# Patient Record
Sex: Female | Born: 1995 | Race: Black or African American | Hispanic: No | Marital: Single | Smoking: Never smoker
Health system: Southern US, Community
[De-identification: ages and names within clinical notes are randomized; demographics above are authoritative.]

## PROBLEM LIST (undated history)

## (undated) DIAGNOSIS — Z789 Other specified health status: Secondary | ICD-10-CM

## (undated) DIAGNOSIS — R55 Syncope and collapse: Secondary | ICD-10-CM

## (undated) DIAGNOSIS — D259 Leiomyoma of uterus, unspecified: Secondary | ICD-10-CM

## (undated) DIAGNOSIS — D509 Iron deficiency anemia, unspecified: Secondary | ICD-10-CM

## (undated) HISTORY — PX: NO PAST SURGERIES: SHX2092

---

## 2017-02-13 DIAGNOSIS — Z87898 Personal history of other specified conditions: Secondary | ICD-10-CM

## 2017-02-13 HISTORY — DX: Personal history of other specified conditions: Z87.898

## 2017-03-12 ENCOUNTER — Observation Stay (HOSPITAL_COMMUNITY)
Admission: EM | Admit: 2017-03-12 | Discharge: 2017-03-13 | Disposition: A | Discharge status: Home / Self Care | Payer: Self-pay | Attending: Internal Medicine | Admitting: Internal Medicine

## 2017-03-12 ENCOUNTER — Encounter (HOSPITAL_COMMUNITY): Payer: Self-pay | Admitting: Radiology

## 2017-03-12 ENCOUNTER — Emergency Department (HOSPITAL_COMMUNITY): Payer: Self-pay

## 2017-03-12 ENCOUNTER — Emergency Department (HOSPITAL_BASED_OUTPATIENT_CLINIC_OR_DEPARTMENT_OTHER): Payer: Self-pay

## 2017-03-12 DIAGNOSIS — R4182 Altered mental status, unspecified: Principal | ICD-10-CM

## 2017-03-12 DIAGNOSIS — R55 Syncope and collapse: Secondary | ICD-10-CM

## 2017-03-12 DIAGNOSIS — R404 Transient alteration of awareness: Secondary | ICD-10-CM | POA: Diagnosis present

## 2017-03-12 DIAGNOSIS — D61818 Other pancytopenia: Secondary | ICD-10-CM | POA: Insufficient documentation

## 2017-03-12 DIAGNOSIS — I361 Nonrheumatic tricuspid (valve) insufficiency: Secondary | ICD-10-CM

## 2017-03-12 DIAGNOSIS — E876 Hypokalemia: Secondary | ICD-10-CM | POA: Insufficient documentation

## 2017-03-12 DIAGNOSIS — I441 Atrioventricular block, second degree: Secondary | ICD-10-CM

## 2017-03-12 DIAGNOSIS — Z8249 Family history of ischemic heart disease and other diseases of the circulatory system: Secondary | ICD-10-CM | POA: Insufficient documentation

## 2017-03-12 DIAGNOSIS — G253 Myoclonus: Secondary | ICD-10-CM | POA: Insufficient documentation

## 2017-03-12 DIAGNOSIS — G9341 Metabolic encephalopathy: Secondary | ICD-10-CM | POA: Insufficient documentation

## 2017-03-12 HISTORY — DX: Syncope and collapse: R55

## 2017-03-12 LAB — RAPID URINE DRUG SCREEN, HOSP PERFORMED
Amphetamines: NOT DETECTED
BARBITURATES: NOT DETECTED
Benzodiazepines: NOT DETECTED
COCAINE: NOT DETECTED
OPIATES: NOT DETECTED
TETRAHYDROCANNABINOL: NOT DETECTED

## 2017-03-12 LAB — ETHANOL: Alcohol, Ethyl (B): <5 mg/dL (ref <5)

## 2017-03-12 LAB — CBC WITH DIFFERENTIAL/PLATELET
BASOS ABS: 0 10*3/uL (ref 0.0–0.1)
Basophils Relative: 0 %
Eosinophils Absolute: 0 10*3/uL (ref 0.0–0.7)
Eosinophils Relative: 0 %
HEMATOCRIT: 31.3 % — AB (ref 36.0–46.0)
HEMOGLOBIN: 9.9 g/dL — AB (ref 12.0–15.0)
LYMPHS ABS: 1.4 10*3/uL (ref 0.7–4.0)
LYMPHS PCT: 45 %
MCH: 23.3 pg — AB (ref 26.0–34.0)
MCHC: 31.6 g/dL (ref 30.0–36.0)
MCV: 73.8 fL — AB (ref 78.0–100.0)
Monocytes Absolute: 0.3 10*3/uL (ref 0.1–1.0)
Monocytes Relative: 8 %
NEUTROS ABS: 1.5 10*3/uL — AB (ref 1.7–7.7)
NEUTROS PCT: 47 %
Platelets: 112 10*3/uL — ABNORMAL LOW (ref 150–400)
RBC: 4.24 MIL/uL (ref 3.87–5.11)
RDW: 15.4 % (ref 11.5–15.5)
WBC: 3.1 10*3/uL — AB (ref 4.0–10.5)

## 2017-03-12 LAB — COMPREHENSIVE METABOLIC PANEL
ALK PHOS: 39 U/L (ref 38–126)
ALT: 13 U/L — AB (ref 14–54)
AST: 25 U/L (ref 15–41)
Albumin: 3.9 g/dL (ref 3.5–5.0)
Anion gap: 8 (ref 5–15)
BUN: 10 mg/dL (ref 6–20)
CALCIUM: 9.2 mg/dL (ref 8.9–10.3)
CO2: 25 mmol/L (ref 22–32)
CREATININE: 0.76 mg/dL (ref 0.44–1.00)
Chloride: 107 mmol/L (ref 101–111)
Glucose, Bld: 105 mg/dL — ABNORMAL HIGH (ref 65–99)
Potassium: 3.4 mmol/L — ABNORMAL LOW (ref 3.5–5.1)
Sodium: 140 mmol/L (ref 135–145)
Total Bilirubin: 0.4 mg/dL (ref 0.3–1.2)
Total Protein: 7.4 g/dL (ref 6.5–8.1)

## 2017-03-12 LAB — LACTATE DEHYDROGENASE: LDH: 128 U/L (ref 98–192)

## 2017-03-12 LAB — HIV ANTIBODY (ROUTINE TESTING W REFLEX): HIV Screen 4th Generation wRfx: NONREACTIVE

## 2017-03-12 LAB — ECHOCARDIOGRAM COMPLETE

## 2017-03-12 LAB — CBG MONITORING, ED: Glucose-Capillary: 127 mg/dL — ABNORMAL HIGH (ref 65–99)

## 2017-03-12 LAB — TSH: TSH: 3.958 u[IU]/mL (ref 0.350–4.500)

## 2017-03-12 LAB — RPR: RPR Ser Ql: NONREACTIVE

## 2017-03-12 LAB — PREGNANCY, URINE: Preg Test, Ur: NEGATIVE

## 2017-03-12 LAB — SAVE SMEAR

## 2017-03-12 LAB — MAGNESIUM: Magnesium: 1.8 mg/dL (ref 1.7–2.4)

## 2017-03-12 LAB — RETICULOCYTES
RBC.: 4.29 MIL/uL (ref 3.87–5.11)
Retic Count, Absolute: 17.2 10*3/uL — ABNORMAL LOW (ref 19.0–186.0)
Retic Ct Pct: 0.4 % (ref 0.4–3.1)

## 2017-03-12 LAB — FIBRINOGEN: FIBRINOGEN: 315 mg/dL (ref 210–475)

## 2017-03-12 MED ORDER — ONDANSETRON HCL 4 MG PO TABS: 4.0000 mg | ORAL_TABLET | Freq: Four times a day (QID) | ORAL | Status: DC | PRN

## 2017-03-12 MED ORDER — SODIUM CHLORIDE 0.9 % IV BOLUS (SEPSIS)
1000.0000 mL | Freq: Once | INTRAVENOUS | Status: AC
  Administered 2017-03-12: 1000 mL via INTRAVENOUS

## 2017-03-12 MED ORDER — ACETAMINOPHEN 325 MG PO TABS: 650.0000 mg | ORAL_TABLET | Freq: Four times a day (QID) | ORAL | Status: DC | PRN

## 2017-03-12 MED ORDER — POTASSIUM CHLORIDE CRYS ER 20 MEQ PO TBCR
40.0000 meq | EXTENDED_RELEASE_TABLET | Freq: Once | ORAL | Status: AC
  Administered 2017-03-12: 40 meq via ORAL
  Filled 2017-03-12: qty 2

## 2017-03-12 MED ORDER — DIPHENHYDRAMINE HCL 50 MG/ML IJ SOLN
25.0000 mg | Freq: Once | INTRAMUSCULAR | Status: AC
  Administered 2017-03-12: 25 mg via INTRAVENOUS
  Filled 2017-03-12: qty 1

## 2017-03-12 MED ORDER — SODIUM CHLORIDE 0.9 % IV SOLN: 250.0000 mL | INTRAVENOUS | Status: DC | PRN

## 2017-03-12 MED ORDER — PROCHLORPERAZINE EDISYLATE 5 MG/ML IJ SOLN
5.0000 mg | Freq: Once | INTRAMUSCULAR | Status: AC
  Administered 2017-03-12: 5 mg via INTRAVENOUS
  Filled 2017-03-12: qty 2

## 2017-03-12 MED ORDER — ACETAMINOPHEN 650 MG RE SUPP: 650.0000 mg | Freq: Four times a day (QID) | RECTAL | Status: DC | PRN

## 2017-03-12 MED ORDER — KETOROLAC TROMETHAMINE 30 MG/ML IJ SOLN
30.0000 mg | Freq: Once | INTRAMUSCULAR | Status: AC
  Administered 2017-03-12: 30 mg via INTRAVENOUS
  Filled 2017-03-12: qty 1

## 2017-03-12 MED ORDER — SODIUM CHLORIDE 0.9% FLUSH: 3.0000 mL | INTRAVENOUS | Status: DC | PRN

## 2017-03-12 MED ORDER — TRAMADOL HCL 50 MG PO TABS: 50.0000 mg | ORAL_TABLET | Freq: Four times a day (QID) | ORAL | Status: DC | PRN

## 2017-03-12 MED ORDER — ONDANSETRON HCL 4 MG/2ML IJ SOLN: 4.0000 mg | Freq: Four times a day (QID) | INTRAMUSCULAR | Status: DC | PRN

## 2017-03-12 MED ORDER — ENOXAPARIN SODIUM 40 MG/0.4ML ~~LOC~~ SOLN
40.0000 mg | SUBCUTANEOUS | Status: DC
  Administered 2017-03-12: 40 mg via SUBCUTANEOUS
  Filled 2017-03-12: qty 0.4

## 2017-03-12 MED ORDER — KETOROLAC TROMETHAMINE 30 MG/ML IJ SOLN
30.0000 mg | Freq: Four times a day (QID) | INTRAMUSCULAR | Status: DC | PRN
Start: 2017-03-12 — End: 2017-03-13

## 2017-03-12 MED ORDER — SODIUM CHLORIDE 0.9% FLUSH
3.0000 mL | Freq: Two times a day (BID) | INTRAVENOUS | Status: DC
  Administered 2017-03-12: 3 mL via INTRAVENOUS

## 2017-03-12 NOTE — ED Notes (Signed)
EKG given to EDP, Ward,MD., for review. 

## 2017-03-12 NOTE — ED Notes (Signed)
Hospitalitis @ bedside.

## 2017-03-12 NOTE — ED Notes (Signed)
MD Notified

## 2017-03-12 NOTE — Progress Notes (Signed)
  Echocardiogram 2D Echocardiogram has been performed.  Leta Jungling M 03/12/2017, 12:22 PM

## 2017-03-12 NOTE — Consult Note (Signed)
Cardiology Consultation:   Patient ID: Trenny Stenseth Franklin; [id number]; September 21, 1995   Admit date: 03/12/2017 Date of Consult: 03/12/2017  Primary Care Provider: Patient, No Pcp Per Primary Cardiologist: New Primary Electrophysiologist:  NA   Patient Profile:   Nicole Benton is a 21 y.o. female with a hx of syncope who is being seen today for the evaluation of syncope and 2:1 block  at the request of Dr. Aroor-Neurology.  History of Present Illness:   Nicole Benton 21 y.o. 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  HR lowest with this is 49.    Pt presented today after loss of consciousness,  She was working night shift and after dinner she placed her head down and was starring off into space.  EMS had difficulty waking her.   She had hiccup noises and intermittent myoclonic jerks.  She was lethargic afterwards.   Her father noted she had similar episodes in [place].  3 episodes there, and she was seen and she was told everything was normal.     In ER on TELE it was noticed she had 2:1 block.  Wenckebach type I.  HR briefly to 49.    CT head was normal.   Neurology has seen and plans outpt EEG, no driving for 6 months and follow up with Neuro as outpt.   MRI is ordered.    EKG is SR normal. Personally reviewed.   Telemetry:  Telemetry was personally reviewed and demonstrates:  SR and brief episodes of wenckebach type I  Labs:  K+ 3.4, Cr 0.76, LFTs normal  WBC 3.1, Hgb 9.9, Hct 31.3 Neg preg test,   Neg ETOH, neg drug screen.  Currently lethargic though wakes to calling her name.  She follows instructions.  Her father and uncle are with her.  Headed to MRI.  She denies any chest pain or SOB.    BP 120/80.   Past Medical History:  Diagnosis Date  . Syncope and collapse     History reviewed. No pertinent surgical history.   Home Medications:  Prior to Admission  medications   Not on File    Inpatient Medications: Scheduled Meds:  Continuous Infusions:  PRN Meds:   Allergies:   No Known Allergies  Social History:   Social History   Social History  . Marital status: Single    Spouse name: N/A  . Number of children: N/A  . Years of education: N/A   Occupational History  . Not on file.   Social History Main Topics  . Smoking status: Never Smoker  . Smokeless tobacco: Never Used  . Alcohol use No  . Drug use: No  . Sexual activity: Not on file   Other Topics Concern  . Not on file   Social History Narrative  . No narrative on file    Family History:    Family History  Problem Relation Age of Onset  . Hypertension Mother   . Heart disease Father   . Hypertension Father      ROS:  Please see the history of present illness.  ROS  General:no colds or fevers, no weight changes Skin:no rashes or ulcers HEENT:no blurred vision, no congestion CV:see HPI PUL:see HPI GI:no diarrhea constipation or melena, no indigestion GU:no hematuria, no dysuria MS:no joint pain, no claudication Neuro:no syncope, no lightheadedness Endo:no diabetes, no thyroid disease .  Physical Exam/Data:   Vitals:   03/12/17 0837 03/12/17 0917 03/12/17 0930 03/12/17 1000  BP:  119/82 131/88 120/80  Pulse: 64 64 70 64  Resp:  (!) 22 14 15   Temp:  97.9 F (36.6 C)    TempSrc:  Oral    SpO2: 100% 100% 100% 100%    Intake/Output Summary (Last 24 hours) at 03/12/17 1138 Last data filed at 03/12/17 0918  Gross per 24 hour  Intake             1000 ml  Output                0 ml  Net             1000 ml   There were no vitals filed for this visit. There is no height or weight on file to calculate BMI.  General:  Well nourished, well developed, in no acute distress lethargic HEENT: normal Lymph: no adenopathy Neck: no JVD Endocrine:  No thryomegaly Vascular: No carotid bruits; 2+ pedal pulses bil.  Cardiac:  normal S1, S2; RRR; no  murmur gallup rub or click Lungs:  clear to auscultation bilaterally, no wheezing, rhonchi or rales  Abd: soft, nontender, no hepatomegaly  Ext: no edema Musculoskeletal:  No deformities, BUE and BLE strength normal and equal Skin: warm and dry  Neuro:  Wakens to name call, moves all extremities, follows commands, lethargic  Psych:  Normal to flat affect     Relevant CV Studies: none  Laboratory Data:  Chemistry  Recent Labs Lab 03/12/17 0404  NA 140  K 3.4*  CL 107  CO2 25  GLUCOSE 105*  BUN 10  CREATININE 0.76  CALCIUM 9.2  GFRNONAA >60  GFRAA >60  ANIONGAP 8     Recent Labs Lab 03/12/17 0404  PROT 7.4  ALBUMIN 3.9  AST 25  ALT 13*  ALKPHOS 39  BILITOT 0.4   Hematology  Recent Labs Lab 03/12/17 0404  WBC 3.1*  RBC 4.24  HGB 9.9*  HCT 31.3*  MCV 73.8*  MCH 23.3*  MCHC 31.6  RDW 15.4  PLT 112*   Cardiac EnzymesNo results for input(s): TROPONINI in the last 168 hours. No results for input(s): TROPIPOC in the last 168 hours.  BNPNo results for input(s): BNP, PROBNP in the last 168 hours.  DDimer No results for input(s): DDIMER in the last 168 hours.  Radiology/Studies:  Ct Head Wo Contrast  Result Date: 03/12/2017 CLINICAL DATA:  Diffuse headache.  Syncopal episode at 02:00. EXAM: CT HEAD WITHOUT CONTRAST TECHNIQUE: Contiguous axial images were obtained from the base of the skull through the vertex without intravenous contrast. COMPARISON:  None. FINDINGS: Brain: There is no intracranial hemorrhage, mass or evidence of acute infarction. There is no extra-axial fluid collection. Gray matter and white matter appear normal. Cerebral volume is normal for age. Brainstem and posterior fossa are unremarkable. The CSF spaces appear normal. Vascular: No hyperdense vessel or unexpected calcification. Skull: Normal. Negative for fracture or focal lesion. Sinuses/Orbits: No acute finding. Other: None. IMPRESSION: Normal brain Electronically Signed   By: Ellery Plunk M.D.   On: 03/12/2017 05:06   Mr Brain Wo Contrast  Result Date: 03/12/2017 CLINICAL DATA:  Altered level consciousness.  Syncopal episode. EXAM: MRI HEAD WITHOUT CONTRAST TECHNIQUE: Multiplanar, multiecho pulse sequences of the brain and surrounding structures were obtained without intravenous contrast. COMPARISON:  CT head without contrast from the same day. FINDINGS: Brain: No acute infarct, hemorrhage, or mass  lesion is present. The ventricles are of normal size. No significant extraaxial fluid collection is present. No significant white matter disease is present. The brainstem and cerebellum are normal. Vascular: Flow is present in the major intracranial arteries. Skull and upper cervical spine: The skullbase is within normal limits. The craniocervical junction is normal. Midline sagittal structures are unremarkable. Sinuses/Orbits: The paranasal sinuses and mastoid air cells are clear. The globes and orbits are within normal limits. IMPRESSION: Negative MRI the brain. Electronically Signed   By: Marin Roberts M.D.   On: 03/12/2017 11:33    Assessment and Plan:   1. Syncope -still with lethargy, for MRI, CT head was normal --MRI of brain normal  2.         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  Will check stat echo to eval.  Dr. Eden Emms to see, but he believes she should stay overnight to monitor.    3.          Anemia and thrombocytopenia.   Signed, Nada Boozer, NP  03/12/2017 11:38 AM   Patient examined chart reviewed. Back from MRI which was normal. She is still lethargic and poorly responsive She did get "migraine" cocktail which may contribute. Telemetry review with no bradycardia and occasional wenkeback type dropped P waves that are not related to her staring episodes and odd behavior. Bedside echo being done and to my review so far normal with EF 60-65% Needs EEG and further toxic/metabolic w/u. No indication for further cardiac work up Exam wit  obese black female clear lungs with poor inspiratory effort no murmur MS lethargic and not communicative at this time   Charlton Haws

## 2017-03-12 NOTE — ED Notes (Signed)
Bed: WA20 Expected date:  Expected time:  Means of arrival:  Comments: 21 yr old syncopal episode

## 2017-03-12 NOTE — ED Triage Notes (Signed)
Pt brought from [facility] after syncopal episode after eating  Lunch at work per coworker and EMT at scene. Pt is from [place] and has been in [place] for the last year and speaks good english but remains mute during questioning

## 2017-03-12 NOTE — ED Provider Notes (Addendum)
Notified by nursing that seems to be having intermittent HB on monitor. Shown 12-lead which clearly sinus with nonspecific t-wave changes in precordial leads. Tele was then subsequently reviewed. Appears to have intermittent 2nd degree block. Mobitz II? Pt evaluated by neurology and recommending MRI. Cleared from neurology standpoint if negative. Possible cardiogenic syncope? Will obtain cardiology consultation. Minimal hypokalemia. Supplement. Check magnesium/tsh.       Raeford Razor, MD 03/12/17 [phone]

## 2017-03-12 NOTE — ED Provider Notes (Signed)
[facility]-EMERGENCY DEPT Provider Note   CSN: [id number] Arrival date & time: 03/12/17  0315     History   Chief Complaint Chief Complaint  Nicole Benton presents with  . Loss of Consciousness    HPI Nicole Benton is a 21 y.o. female Who is previously healthy who presents following a suspected syncopal episode. Nicole Benton was at work night shift and having lunch when she put her head down on the table according to her friend. Since that time, Nicole Benton has not talked and has been staring off into space. She has not been following commands. Nicole Benton intermittently has had shaking episodes. According to the Nicole Benton's father, episodes like this have happened 3 times before. Nicole Benton is from [place] and [place]. It has not happened since she moved.  HPI  History reviewed. No pertinent past medical history.  There are no active problems to display for this Nicole Benton.   No past surgical history on file.  OB History    No data available       Home Medications    Prior to Admission medications   Not on File    Family History No family history on file.  Social History Social History  Substance Use Topics  . Smoking status: Not on file  . Smokeless tobacco: Not on file  . Alcohol use Not on file     Allergies   Nicole Benton has no known allergies.   Review of Systems Review of Systems  Unable to perform ROS: Mental status change     Physical Exam Updated Vital Signs BP (!) 169/87 (BP Location: Left Arm)   Pulse 76   Temp 99.6 F (37.6 C) (Oral)   Resp 17   LMP 02/21/2017 (Exact Date)   SpO2 100%   Physical Exam  Constitutional: She appears well-developed and well-nourished. No distress.  HENT:  Head: Normocephalic and atraumatic.  Mouth/Throat: Oropharynx is clear and moist. No oropharyngeal exudate.  Eyes: Pupils are equal, round, and reactive to light. Conjunctivae are normal. Right eye exhibits no discharge. Left eye exhibits no  discharge. No scleral icterus.  Neck: Normal range of motion. Neck supple. No thyromegaly present.  Cardiovascular: Normal rate, regular rhythm, normal heart sounds and intact distal pulses.  Exam reveals no gallop and no friction rub.   No murmur heard. Pulmonary/Chest: Effort normal and breath sounds normal. No stridor. No respiratory distress. She has no wheezes. She has no rales.  Abdominal: Soft. Bowel sounds are normal. She exhibits no distension. There is no tenderness. There is no rebound and no guarding.  Musculoskeletal: She exhibits no edema.  Lymphadenopathy:    She has no cervical adenopathy.  Neurological: She is alert. Coordination normal.  Nicole Benton initially not following commands and could not complete neuro exam  Upon return to baseline: CN 3-12 intact; normal sensation throughout; 5/5 strength in all 4 extremities; equal bilateral grip strength  Skin: Skin is warm and dry. No rash noted. She is not diaphoretic. No pallor.  Psychiatric: She has a normal mood and affect.  Nursing note and vitals reviewed.    ED Treatments / Results  Labs (all labs ordered are listed, but only abnormal results are displayed) Labs Reviewed  CBC WITH DIFFERENTIAL/PLATELET - Abnormal; Notable for the following:       Result Value   WBC 3.1 (*)    Hemoglobin 9.9 (*)    HCT 31.3 (*)    MCV 73.8 (*)    MCH 23.3 (*)  Platelets 112 (*)    Neutro Abs 1.5 (*)    All other components within normal limits  COMPREHENSIVE METABOLIC PANEL - Abnormal; Notable for the following:    Potassium 3.4 (*)    Glucose, Bld 105 (*)    ALT 13 (*)    All other components within normal limits  CBG MONITORING, ED - Abnormal; Notable for the following:    Glucose-Capillary 127 (*)    All other components within normal limits  RAPID URINE DRUG SCREEN, HOSP PERFORMED  PREGNANCY, URINE  ETHANOL  RPR  HIV ANTIBODY (ROUTINE TESTING)    EKG  EKG Interpretation None       Radiology Ct Head Wo  Contrast  Result Date: 03/12/2017 CLINICAL DATA:  Diffuse headache.  Syncopal episode at 02:00. EXAM: CT HEAD WITHOUT CONTRAST TECHNIQUE: Contiguous axial images were obtained from the base of the skull through the vertex without intravenous contrast. COMPARISON:  None. FINDINGS: Brain: There is no intracranial hemorrhage, mass or evidence of acute infarction. There is no extra-axial fluid collection. Gray matter and white matter appear normal. Cerebral volume is normal for age. Brainstem and posterior fossa are unremarkable. The CSF spaces appear normal. Vascular: No hyperdense vessel or unexpected calcification. Skull: Normal. Negative for fracture or focal lesion. Sinuses/Orbits: No acute finding. Other: None. IMPRESSION: Normal brain Electronically Signed   By: Ellery Plunk M.D.   On: 03/12/2017 05:06    Procedures Procedures (including critical care time)  Medications Ordered in ED Medications  ketorolac (TORADOL) 30 MG/ML injection 30 mg (not administered)  sodium chloride 0.9 % bolus 1,000 mL (not administered)  prochlorperazine (COMPAZINE) injection 5 mg (not administered)  diphenhydrAMINE (BENADRYL) injection 25 mg (not administered)     Initial Impression / Assessment and Plan / ED Course  I have reviewed the triage vital signs and the nursing notes.  Pertinent labs & imaging results that were available during my care of the Nicole Benton were reviewed by me and considered in my medical decision making (see chart for details).     Upon Nicole Benton's return to baseline after no intervention in the ED, Nicole Benton reports that she does not remember what happened prior to feeling lightheaded and putting her head down on the table at lunch today. Nicole Benton reports this has happened 3 times in high school and she was sent to the hospital, but is not sure what happened or if she ever received a diagnosis. Nicole Benton denies any SI, HI, AVH. He denies any drug or alcohol use. She denies any concerns at  home or anyone hurting her. Nicole Benton does report she has had a headache for the past 3-4 days. It was improved with Advil at home. She does note that she hasn't been drinking very much water or eating much lately. I will order Toradol, Compazine, Benadryl, fluids.  Nicole Benton is pancytopenic, otherwise labs unremarkable. HIV, RPR pending. CT head is negative. Upon Nicole Benton's return to baseline, neuro exam is normal. I consulted neurologist, Dr. Laurence Slate, who recommends MRI of the brain and will evaluate the Nicole Benton and determine disposition. At shift change, Nicole Benton care transferred to Langston Masker, PA-C for continued evaluation, follow up of MR and neuro consul and determination of disposition. Anticipate discharge if MRI negative and cleared by neurology. Nicole Benton also evaluated by Dr. Elesa Massed who guided the Nicole Benton's management and agrees with plan.  Final Clinical Impressions(s) / ED Diagnoses   Final diagnoses:  Altered mental status, unspecified altered mental status type    New Prescriptions New Prescriptions  No medications on file     Emi Holes, [place] 03/12/17 3716

## 2017-03-12 NOTE — ED Triage Notes (Signed)
Pt father names is Revonda Humphrey cellphone [phone] and has not been reached to notify of daughters illness.

## 2017-03-12 NOTE — H&P (Signed)
History and Physical    Nicole Benton PQZ:[id number] DOB: 07-17-1995 DOA: 03/12/2017  PCP: Patient, No Pcp Per  Patient coming from: Home  Chief Complaint: Altered level of consciousness  HPI: Nicole Benton is a 21 y.o. female with no significant previous medical history. She presents emergency department after she was found to have a "staring" episode that was noted by her friend. This lasted more than 10 minutes. When the paramedics arrived, patient was difficult to arouse, continued to stare off into space, with intermittent hiccup noise with intermittent myoclonic jerks. She was lethargic for a few hours and has now slowly returned to baseline. Currently in the emergency department, patient has received migraine cocktail of Benadryl, Toradol, Compazine. She also works night shifts. She is very lethargic, awakens to voice but does not stay awake. I am unable to gather any meaningful history from patient or review of systems. History is gathered from ED physician, chart review, bedside RN.  ED Course: Patient was evaluated by neurology. The etiology of this spell is unclear, possible cardiovascular syncope versus seizure versus nonepileptic psychogenic spell. MRI was negative for structural abnormality. EKG revealed AV block and cardiology was consulted. They recommend echocardiogram and monitoring on telemetry overnight.  Review of Systems: Unable to gather as patient is very somnolent and lethargic in the emergency department.  Past Medical History:  Diagnosis Date  . Syncope and collapse     History reviewed. No pertinent surgical history.   reports that she has never smoked. She has never used smokeless tobacco. She reports that she does not drink alcohol or use drugs.  No Known Allergies  Family History  Problem Relation Age of Onset  . Hypertension Mother   . Heart disease Father   . Hypertension Father     Prior to Admission medications   Not on File     Physical Exam: Vitals:   03/12/17 0917 03/12/17 0930 03/12/17 1000 03/12/17 1226  BP: 119/82 131/88 120/80 109/62  Pulse: 64 70 64 62  Resp: (!) 22 14 15 18   Temp: 97.9 F (36.6 C)     TempSrc: Oral     SpO2: 100% 100% 100% 100%    Constitutional: NAD, calm, comfortable, lethargic, arousable to voice but does not stay awake  Eyes: PERRL, lids and conjunctivae normal ENMT: Mucous membranes are moist. Posterior pharynx clear of any exudate or lesions.Normal dentition.  Neck: normal, supple, no masses, no thyromegaly Respiratory: clear to auscultation bilaterally, no wheezing, no crackles. Normal respiratory effort. No accessory muscle use.  Cardiovascular: Regular rate and rhythm, no murmurs / rubs / gallops. No extremity edema.  Abdomen: no tenderness, no masses palpated. No hepatosplenomegaly. Bowel sounds positive.  Musculoskeletal: no clubbing / cyanosis. No joint deformity upper and lower extremities. Good ROM, no contractures. Normal muscle tone.  Skin: no rashes, lesions, ulcers. No induration Neurologic: Alert to voice, moves all extremities, very lethargic Psychiatric: Alert and oriented  Labs on Admission: I have personally reviewed following labs and imaging studies  CBC:  Recent Labs Lab 03/12/17 0404  WBC 3.1*  NEUTROABS 1.5*  HGB 9.9*  HCT 31.3*  MCV 73.8*  PLT 112*   Basic Metabolic Panel:  Recent Labs Lab 03/12/17 0404 03/12/17 0926  NA 140  --   K 3.4*  --   CL 107  --   CO2 25  --   GLUCOSE 105*  --   BUN 10  --   CREATININE 0.76  --   CALCIUM 9.2  --  MG  --  1.8   GFR: CrCl cannot be calculated (Unknown ideal weight.). Liver Function Tests:  Recent Labs Lab 03/12/17 0404  AST 25  ALT 13*  ALKPHOS 39  BILITOT 0.4  PROT 7.4  ALBUMIN 3.9   No results for input(s): LIPASE, AMYLASE in the last 168 hours. No results for input(s): AMMONIA in the last 168 hours. Coagulation Profile: No results for input(s): INR, PROTIME in the  last 168 hours. Cardiac Enzymes: No results for input(s): CKTOTAL, CKMB, CKMBINDEX, TROPONINI in the last 168 hours. BNP (last 3 results) No results for input(s): PROBNP in the last 8760 hours. HbA1C: No results for input(s): HGBA1C in the last 72 hours. CBG:  Recent Labs Lab 03/12/17 0330  GLUCAP 127*   Lipid Profile: No results for input(s): CHOL, HDL, LDLCALC, TRIG, CHOLHDL, LDLDIRECT in the last 72 hours. Thyroid Function Tests:  Recent Labs  03/12/17 0926  TSH 3.958   Anemia Panel: No results for input(s): VITAMINB12, FOLATE, FERRITIN, TIBC, IRON, RETICCTPCT in the last 72 hours. Urine analysis: No results found for: COLORURINE, APPEARANCEUR, LABSPEC, PHURINE, GLUCOSEU, HGBUR, BILIRUBINUR, KETONESUR, PROTEINUR, UROBILINOGEN, NITRITE, LEUKOCYTESUR Sepsis Labs: !!!!!!!!!!!!!!!!!!!!!!!!!!!!!!!!!!!!!!!!!!!! @LABRCNTIP (procalcitonin:4,lacticidven:4) )No results found for this or any previous visit (from the past 240 hour(s)).   Radiological Exams on Admission: Ct Head Wo Contrast  Result Date: 03/12/2017 CLINICAL DATA:  Diffuse headache.  Syncopal episode at 02:00. EXAM: CT HEAD WITHOUT CONTRAST TECHNIQUE: Contiguous axial images were obtained from the base of the skull through the vertex without intravenous contrast. COMPARISON:  None. FINDINGS: Brain: There is no intracranial hemorrhage, mass or evidence of acute infarction. There is no extra-axial fluid collection. Gray matter and white matter appear normal. Cerebral volume is normal for age. Brainstem and posterior fossa are unremarkable. The CSF spaces appear normal. Vascular: No hyperdense vessel or unexpected calcification. Skull: Normal. Negative for fracture or focal lesion. Sinuses/Orbits: No acute finding. Other: None. IMPRESSION: Normal brain Electronically Signed   By: Ellery Plunk M.D.   On: 03/12/2017 05:06   Mr Brain Wo Contrast  Result Date: 03/12/2017 CLINICAL DATA:  Altered level consciousness.  Syncopal  episode. EXAM: MRI HEAD WITHOUT CONTRAST TECHNIQUE: Multiplanar, multiecho pulse sequences of the brain and surrounding structures were obtained without intravenous contrast. COMPARISON:  CT head without contrast from the same day. FINDINGS: Brain: No acute infarct, hemorrhage, or mass lesion is present. The ventricles are of normal size. No significant extraaxial fluid collection is present. No significant white matter disease is present. The brainstem and cerebellum are normal. Vascular: Flow is present in the major intracranial arteries. Skull and upper cervical spine: The skullbase is within normal limits. The craniocervical junction is normal. Midline sagittal structures are unremarkable. Sinuses/Orbits: The paranasal sinuses and mastoid air cells are clear. The globes and orbits are within normal limits. IMPRESSION: Negative MRI the brain. Electronically Signed   By: Marin Roberts M.D.   On: 03/12/2017 11:33    EKG: Independently reviewed. Normal sinus rhythm with right axis deviation.  Assessment/Plan Principal Problem:   Altered level of consciousness  Altered level of consciousness -Possible etiologyof cardiovascular syncope, seizure, nonepileptic psychogenic spell -MRI brain unremarkable, UDS negative -Evaluated by Neurology, recommended EEG outpatient and outpatient neurology follow up. No antiepileptics as it is unclear if these are seizure spells or not. No driving x 6 months, avoid operating heavy machinery, climbing heights, swimming -Neuro checks q4h -May warrant psych consult if without improvement next 24 hours and possibly inpatient EEG   Wenckebach type 1 -  Cardiology following. Discussed with Dr. Eden Emms, her staring episode is not likelyto be related to her occasional Wenckebach with dropped P wave.  -Echo pending official results, Dr. Eden Emms reviewed at bedside and EF 60-65% -No plan for further cardiac work up  -Continue telemetry   Hypokalemia -Replaced in ED,  trend   Pancytopenia -?Etiology -Anemia panel    DVT prophylaxis: lovenox Code Status: Full Code assumed. No family at bedside, patient very lethargic and does not participate in exam or answer questions Family Communication: no family at bedside Disposition Plan: Pending next 24 hour course, stabilization and improvement. Suspect she can return home when stable Consults called: Neurology, Cardiology   Admission status: observation   Severity of Illness: The appropriate patient status for this patient is OBSERVATION. Observation status is judged to be reasonable and necessary in order to provide the required intensity of service to ensure the patient's safety. The patient's presenting symptoms, physical exam findings, and initial radiographic and laboratory data in the context of their medical condition is felt to place them at decreased risk for further clinical deterioration. Furthermore, it is anticipated that the patient will be medically stable for discharge from the hospital within 2 midnights of admission. The following factors support the patient status of observation.   " The patient's presenting symptoms include altered level of consciousness, staring episodes. " The physical exam findings include lethargy. " The initial radiographic and laboratory data are Wenckebach on telemetry.  Noralee Stain, DO [facility] [email or web address] Password [email or web address] 03/12/2017, 1:06 PM

## 2017-03-12 NOTE — ED Provider Notes (Signed)
Medical screening examination/treatment/procedure(s) were conducted as a shared visit with non-physician practitioner(s) and myself.  I personally evaluated the patient during the encounter.   EKG Interpretation None      Patient is a 21 year old female with no significant past medical history who presents to the emergency department with altered mental status.  Patient was doing well at work and eating lunch with her coworker when she laid her head down on the table and then became less responsive. It is unclear if there was a true syncopal event. She has been altered since that time. She will not answer questions or follow commands. She does seem to be moving her extremities normally on her own. She will not make eye contact and is looking around the room.  Her blood glucose is normal. Her labs show mild pancytopenia. Otherwise unremarkable. Head CT unremarkable. Neurology has been consulted. They recommended MRI of her brain. Patient's father has arrived and she now is able to talk and has a normal neurologic exam.  Plan was to have neurology evaluate the patient for further evaluation.   After my leaving the emergency department, it appears patient was noted to have episodes of intermittent heart block. Cardiology was consulted as well. Patient was admitted to medicine.   Ward, Layla Maw, DO 03/13/17 [phone]

## 2017-03-12 NOTE — ED Notes (Signed)
Urine specimen requested 

## 2017-03-12 NOTE — ED Notes (Signed)
Noticed patient has Heart Block. Printing EKG.

## 2017-03-12 NOTE — ED Notes (Signed)
Patient transported to MRI 

## 2017-03-12 NOTE — Consult Note (Addendum)
Neurology Consultation Reason for Consult: Passing out episode Referring Physician: Dr. Marylen Ponto  CC: Spell   HPI: Nicole Benton is a 21 y.o. female from [place] the past medical history of headaches who presents to the emergency room after having an event of loss of responsiveness.  She was working night shift, and after having dinner her friend noticed that she later placed her hand down on the table and was staring off. He states that this left episode lasted for more than 10 minutes and he failed in his attempts to bring her back to attention.  She would just stare off into space but denies any gaze deviation. He finally called for help and that when the paramedics arrived she was difficult to arouse. She continued to stare off into space and intermittently would make hiccup noises and have intermittent myoclonic jerks.  He attempted to take a video however is told by the EMS not to do so. The patient was lethargic for a few hours and has now slowly return to her baseline. Assessment she is talking and answering questions however seems to be disinterested and someone noncooperative when exam. She kept stating she was cold during the counter and was high underneath the blankets. She states that she does not remember anything preceding the event except that she had her usual headache  Father states that she had similar episodes while in [place]. The first episode occurred in October 2015 according to the patient. She was at school and was told she had a staring off spell. One thing she remembers she was in the hospital and was told by the doctor said everything was okay. The father contacted the patient's mother over the phone my presence and was told a similar story. Apparently she's been to the hospital about 3 times every time they would discharge her stating everything was fine and gave her paracetamol.  She also had an episode where she was talking to herself according to her father.  ROS:  A 14 point ROS was performed and is negative except as noted in the HPI.   History reviewed. No pertinent past medical history.   No family history on file. No family history of seizures. Her father has hypertension   Social History:  has no tobacco, alcohol, and drug history on file.   Exam: Current vital signs: BP 121/83   Pulse 71   Temp 98.2 F (36.8 C) (Oral)   Resp 18   LMP 02/21/2017 (Exact Date)   SpO2 100%  Vital signs in last 24 hours: Temp:  [98.2 F (36.8 C)-99.6 F (37.6 C)] 98.2 F (36.8 C) (08/28 0805) Pulse Rate:  [71-90] 71 (08/28 0805) Resp:  [16-24] 18 (08/28 0805) BP: (105-169)/(72-89) 121/83 (08/28 0805) SpO2:  [99 %-100 %] 100 % (08/28 0805)   Physical Exam  Constitutional: Appears well-developed and well-nourished,  cooperative on exam but requires repeated requests Psych: Affect appropriate to situation Eyes: No scleral injection HENT: No OP obstrucion Head: Normocephalic.  Cardiovascular: Normal rate and regular rhythm.  Respiratory: Effort normal and breath sounds normal to anterior ascultation GI: Soft.  No distension. There is no tenderness.  Skin: WDI  Neuro: Mental Status: Patient is awake, alert, oriented to person, place, month, year, and situation. No signs of aphasia or neglect Cranial Nerves: II: Visual Fields are full. Pupils are equal, round, and reactive to light.   III,IV, VI: EOMI without ptosis or diploplia.  V: Facial sensation is symmetric to temperature VII: Facial movement  is symmetric.  VIII: hearing is intact to voice X: Uvula elevates symmetrically XI: Shoulder shrug is symmetric. XII: tongue is midline without atrophy or fasciculations.  Motor: Tone is normal. Bulk is normal. 5/5 strength was present in all four extremities.  Sensory: Sensation is symmetric to light touch and temperature in the arms and legs. Deep Tendon Reflexes: 2+ and symmetric in the biceps and patellae.  Plantars: Toes are downgoing  bilaterally.  Cerebellar: FNF and HKS are intact bilaterally   EXAM: CT HEAD WITHOUT CONTRAST  TECHNIQUE: Contiguous axial images were obtained from the base of the skull through the vertex without intravenous contrast.  COMPARISON:  None.  FINDINGS: Brain: There is no intracranial hemorrhage, mass or evidence of acute infarction. There is no extra-axial fluid collection. Gray matter and white matter appear normal. Cerebral volume is normal for age. Brainstem and posterior fossa are unremarkable. The CSF spaces appear normal.  Vascular: No hyperdense vessel or unexpected calcification.  Skull: Normal. Negative for fracture or focal lesion.  Sinuses/Orbits: No acute finding.  Other: None.  IMPRESSION: Normal brain   Electronically Signed   By: Ellery Plunk M.D.   On: 03/12/2017 05:06    ASSESSMENT AND PLAN  Spells Unclear as to etiology of her spells-differentials include  cardiovascular syncope, seizures or nonepileptic psychogenic spells  Recommend brain MRI to rule out any structural abnormality EKG shows AV block, cardiology consult Routine EEG outpatient Ambulatory referral to neurology No AEDS as unclear if these spells are seizures No driving x 6 months, avoid operating heavy machinery, climbing heights, swimming - counselled patient and family regarding this.    Please call page Neurohospitalist on call ( Dr Amada Jupiter) if MRI shows abnormality      Georgiana Spinner Aroor MD [facility] [phone]  If 7pm to 7am, please call on call as listed on AMION.

## 2017-03-12 NOTE — ED Notes (Signed)
Cardiologist MD at bedside

## 2017-03-12 NOTE — ED Notes (Signed)
Nicole Benton receiving RN [phone]. Report scheduled for 1510

## 2017-03-12 NOTE — ED Provider Notes (Signed)
Neurology evaluated pt here.   Cardiology evaluated pt.  Cardiology plans to do echo.  Dr. Eden Emms has advised to have medicine admit for observation    Nicole Benton 03/12/17 1142    Raeford Razor, MD 03/12/17 [phone]

## 2017-03-12 NOTE — ED Notes (Signed)
Neuro MD at bedside

## 2017-03-13 ENCOUNTER — Observation Stay (HOSPITAL_BASED_OUTPATIENT_CLINIC_OR_DEPARTMENT_OTHER)
Admit: 2017-03-13 | Discharge: 2017-03-13 | Disposition: A | Discharge status: Home / Self Care | Payer: Self-pay | Attending: Neurology | Admitting: Neurology

## 2017-03-13 DIAGNOSIS — R404 Transient alteration of awareness: Secondary | ICD-10-CM

## 2017-03-13 LAB — CBC
HEMATOCRIT: 30 % — AB (ref 36.0–46.0)
HEMOGLOBIN: 9.4 g/dL — AB (ref 12.0–15.0)
MCH: 23.6 pg — AB (ref 26.0–34.0)
MCHC: 31.3 g/dL (ref 30.0–36.0)
MCV: 75.2 fL — ABNORMAL LOW (ref 78.0–100.0)
Platelets: 103 10*3/uL — ABNORMAL LOW (ref 150–400)
RBC: 3.99 MIL/uL (ref 3.87–5.11)
RDW: 15.6 % — AB (ref 11.5–15.5)
WBC: 2.6 10*3/uL — ABNORMAL LOW (ref 4.0–10.5)

## 2017-03-13 LAB — BASIC METABOLIC PANEL
Anion gap: 7 (ref 5–15)
BUN: 9 mg/dL (ref 6–20)
CALCIUM: 8.3 mg/dL — AB (ref 8.9–10.3)
CHLORIDE: 107 mmol/L (ref 101–111)
CO2: 22 mmol/L (ref 22–32)
CREATININE: 0.72 mg/dL (ref 0.44–1.00)
GFR calc non Af Amer: >60 mL/min (ref >60)
GLUCOSE: 83 mg/dL (ref 65–99)
Potassium: 3.6 mmol/L (ref 3.5–5.1)
Sodium: 136 mmol/L (ref 135–145)

## 2017-03-13 LAB — IRON AND TIBC
Iron: 19 ug/dL — ABNORMAL LOW (ref 28–170)
SATURATION RATIOS: 6 % — AB (ref 10.4–31.8)
TIBC: 335 ug/dL (ref 250–450)
UIBC: 316 ug/dL

## 2017-03-13 LAB — FOLATE RBC
FOLATE, HEMOLYSATE: 252.4 ng/mL
Folate, RBC: 806 ng/mL (ref >498)
Hematocrit: 31.3 % — ABNORMAL LOW (ref 34.0–46.6)

## 2017-03-13 LAB — VITAMIN B12: VITAMIN B 12: 740 pg/mL (ref 180–914)

## 2017-03-13 LAB — HAPTOGLOBIN: Haptoglobin: 174 mg/dL (ref 34–200)

## 2017-03-13 LAB — FERRITIN: Ferritin: 49 ng/mL (ref 11–307)

## 2017-03-13 NOTE — Care Management Note (Signed)
Case Management Note  Patient Details  Name: Nicole Michaelisugenia Andrews Benton MRN: [id number] Date of Birth: Oct 24, 1995  Subjective/Objective:  21 y/o f admitted w/Altered level of consciousness. 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 Has parent support. No pcp-provided w/pcp listing-encouraged [facility]--Father has called [facility] for hospital f/u appt. No further CM needs.                  Action/Plan:d/c home.   Expected Discharge Date:   (unknown)               Expected Discharge Plan:  Home/Self Care  In-House Referral:     Discharge planning Services  CM Consult, [facility]  Post Acute Care Choice:    Choice offered to:     DME Arranged:    DME Agency:     HH Arranged:    HH Agency:     Status of Service:  Completed, signed off  If discussed at [employer] Length of Stay Meetings, dates discussed:    Additional Comments:  Lanier ClamMahabir, Kache Mcclurg, RN 03/13/2017, 10:10 AM

## 2017-03-13 NOTE — Procedures (Signed)
ELECTROENCEPHALOGRAM REPORT  Date of Study: 03/13/2017  Patient's Name: Nicole Benton MRN: [id number] Date of Birth: [birth year]  Referring Provider: Dr. Ritta SlotMcNeill Kirkpatrick  Clinical History: This is a 21 year old woman with episodes of loss of responsiveness and staring.  Medications: acetaminophen (TYLENOL) tablet 650 mg  enoxaparin (LOVENOX) injection 40 mg  ketorolac (TORADOL) 30 MG/ML injection 30 mg  traMADol (ULTRAM) tablet 50 mg   Technical Summary: A multichannel digital EEG recording measured by the international 10-20 system with electrodes applied with paste and impedances below 5000 ohms performed in our laboratory with EKG monitoring in an awake and asleep patient.  Hyperventilation and photic stimulation were performed.  The digital EEG was referentially recorded, reformatted, and digitally filtered in a variety of bipolar and referential montages for optimal display.    Description: The patient is awake and asleep during the recording.  During maximal wakefulness, there is a symmetric, medium voltage 9 Hz posterior dominant rhythm that attenuates with eye opening.  The record is symmetric.  During drowsiness and sleep, there is an increase in theta slowing of the background, at times occurring in higher voltage bursts during drowsiness.  Vertex waves and symmetric sleep spindles were seen.  Hyperventilation and photic stimulation did not elicit any abnormalities.  There were no epileptiform discharges or electrographic seizures seen.    EKG lead was unremarkable.  Impression: This awake and asleep EEG is normal.    Clinical Correlation: A normal EEG does not exclude a clinical diagnosis of epilepsy.  Clinical correlation is advised.   Patrcia DollyKaren Myya Meenach, M.D.

## 2017-03-13 NOTE — Discharge Instructions (Signed)

## 2017-03-13 NOTE — Progress Notes (Signed)
Subjective: No further spells  Exam: Vitals:   03/12/17 2119 03/13/17 0512  BP: (!) 110/54 121/74  Pulse: 66 63  Resp: 18 16  Temp: 98.3 F (36.8 C) 98.2 F (36.8 C)  SpO2: 100% 100%    HEENT-  Normocephalic, no lesions, without obvious abnormality.  Normal external eye and conjunctiva.  Normal TM's bilaterally.  Normal auditory canals and external ears. Normal external nose, mucus membranes and septum.  Normal pharynx.    Neuro:  CN: Pupils are equal and round. They are symmetrically reactive from 3-->2 mm. EOMI without nystagmus. Facial sensation is intact to light touch. Face is symmetric at rest with normal strength and mobility. Hearing is intact to conversational voice. Palate elevates symmetrically and uvula is midline. Voice is normal in tone, pitch and quality. Bilateral SCM and trapezii are 5/5. Tongue is midline with normal bulk and mobility.  Motor: Normal bulk, tone, and strength. 5/5 throughout. No drift.  Sensation: Intact to light touch.  DTRs: 2+, symmetric  Toes downgoing bilaterally. No pathologic reflexes.  Coordination: Finger-to-nose and heel-to-shin are without dysmetria     Pertinent Labs/Diagnostics: MRI (-) EEG pending    Impression:  Spells Unclear as to etiology of her spells-differentials include  cardiovascular syncope, seizures or nonepileptic psychogenic spells. Cardiology does not feel this is the cause at this time. IF EEG is negative no further work up from neuro stand point. IF happens again would have her estabilish a out patint neurologist. Neurology S/O  Felicie Mornavid Smith PA-C [facility] [phone]   03/13/2017, 9:59 AM

## 2017-03-13 NOTE — Discharge Summary (Signed)
Physician Discharge Summary  Nicole Benton WUJ:[id number] DOB: 17-Dec-1995 DOA: 03/12/2017  PCP: Patient, No Pcp Per  Admit date: 03/12/2017 Discharge date: 03/13/2017  Recommendations for Outpatient Follow-up:  1. Please follow up with PCP in 1 week, recheck CBC and if it is lower in regards to platelets or WBC count pt needs to follow up with hematology (number provided on d/c summary)  Discharge Diagnoses:  Principal Problem:   Altered level of consciousness    Discharge Condition: stable   Diet recommendation: as tolerated   History of present illness:   Per HPI "21 y.o. female with no significant previous medical history. She presents emergency department after she was found to have a "staring" episode that was noted by her friend. This lasted more than 10 minutes. When the paramedics arrived, patient was difficult to arouse, continued to stare off into space, with intermittent hiccup noise with intermittent myoclonic jerks. She was lethargic for a few hours and has now slowly returned to baseline. Currently in the emergency department, patient has received migraine cocktail of Benadryl, Toradol, Compazine. She also works night shifts. She is very lethargic, awakens to voice but does not stay awake. I am unable to gather any meaningful history from patient or review of systems. History is gathered from ED physician, chart review, bedside RN. Patient was evaluated by neurology. The etiology of this spell is unclear, possible cardiovascular syncope versus seizure versus nonepileptic psychogenic spell. MRI was negative for structural abnormality. EKG revealed AV block and cardiology was consulted. They recommend echocardiogram and monitoring on telemetry overnight."   Hospital Course:    Principal Problem: Acute metabolic encephalopathy - Unclear etiology - CT head and MRI brain negative - EEG - pending - Not on any antiepileptic meds - Mental status at  baseline  Pancytopenia - HIV negative - RPR non reactive - Needs out pt follow up in 1 week (i spoke with Dr. Candise Che who said pt can follow up outpt in 1 week, repeat cbc and if lower she need to follow up with hematology further; this was communicated to the pt    Signed:  Manson Passey, MD  [facility] 03/13/2017, 10:17 AM  Pager #: [phone]  Time spent in minutes: more than 30 minutes    Discharge Exam: Vitals:   03/12/17 2119 03/13/17 0512  BP: (!) 110/54 121/74  Pulse: 66 63  Resp: 18 16  Temp: 98.3 F (36.8 C) 98.2 F (36.8 C)  SpO2: 100% 100%   Vitals:   03/12/17 1500 03/12/17 1615 03/12/17 2119 03/13/17 0512  BP: 112/69 120/60 (!) 110/54 121/74  Pulse: 66 71 66 63  Resp: 19 20 18 16   Temp:  98.3 F (36.8 C) 98.3 F (36.8 C) 98.2 F (36.8 C)  TempSrc:  Oral Oral Oral  SpO2: 100% 100% 100% 100%  Weight:  71.3 kg (157 lb 3 oz)    Height:  5\' 3"  (1.6 m)      General: Pt is alert, follows commands appropriately, not in acute distress Cardiovascular: Regular rate and rhythm, S1/S2 +, no murmurs Respiratory: Clear to auscultation bilaterally, no wheezing, no crackles, no rhonchi Abdominal: Soft, non tender, non distended, bowel sounds +, no guarding Extremities: no edema, no cyanosis, pulses palpable bilaterally DP and PT Neuro: Grossly nonfocal  Discharge Instructions  Discharge Instructions    Call MD for:  persistant nausea and vomiting    Complete by:  As directed    Call MD for:  redness, tenderness, or signs of  infection (pain, swelling, redness, odor or green/yellow discharge around incision site)    Complete by:  As directed    Call MD for:  severe uncontrolled pain    Complete by:  As directed    Diet - low sodium heart healthy    Complete by:  As directed    Increase activity slowly    Complete by:  As directed      Allergies as of 03/13/2017   No Known Allergies     Medication List    You have not been prescribed any  medications.          Discharge Care Instructions        Start     Ordered   03/13/17 0000  Increase activity slowly     03/13/17 1016   03/13/17 0000  Diet - low sodium heart healthy     03/13/17 1016   03/13/17 0000  Call MD for:  persistant nausea and vomiting     03/13/17 1016   03/13/17 0000  Call MD for:  severe uncontrolled pain     03/13/17 1016   03/13/17 0000  Call MD for:  redness, tenderness, or signs of infection (pain, swelling, redness, odor or green/yellow discharge around incision site)     03/13/17 1016     Follow-up Information    [facility]. Schedule an appointment as soon as possible for a visit.   Why:  make appt within 1 week;bring photo ID,discharge instructions Contact information: [address] [facility] [address] [phone]           The results of significant diagnostics from this hospitalization (including imaging, microbiology, ancillary and laboratory) are listed below for reference.    Significant Diagnostic Studies: Ct Head Wo Contrast  Result Date: 03/12/2017 CLINICAL DATA:  Diffuse headache.  Syncopal episode at 02:00. EXAM: CT HEAD WITHOUT CONTRAST TECHNIQUE: Contiguous axial images were obtained from the base of the skull through the vertex without intravenous contrast. COMPARISON:  None. FINDINGS: Brain: There is no intracranial hemorrhage, mass or evidence of acute infarction. There is no extra-axial fluid collection. Gray matter and white matter appear normal. Cerebral volume is normal for age. Brainstem and posterior fossa are unremarkable. The CSF spaces appear normal. Vascular: No hyperdense vessel or unexpected calcification. Skull: Normal. Negative for fracture or focal lesion. Sinuses/Orbits: No acute finding. Other: None. IMPRESSION: Normal brain Electronically Signed   By: Ellery Plunk M.D.   On: 03/12/2017 05:06   Mr Brain Wo Contrast  Result Date:  03/12/2017 CLINICAL DATA:  Altered level consciousness.  Syncopal episode. EXAM: MRI HEAD WITHOUT CONTRAST TECHNIQUE: Multiplanar, multiecho pulse sequences of the brain and surrounding structures were obtained without intravenous contrast. COMPARISON:  CT head without contrast from the same day. FINDINGS: Brain: No acute infarct, hemorrhage, or mass lesion is present. The ventricles are of normal size. No significant extraaxial fluid collection is present. No significant white matter disease is present. The brainstem and cerebellum are normal. Vascular: Flow is present in the major intracranial arteries. Skull and upper cervical spine: The skullbase is within normal limits. The craniocervical junction is normal. Midline sagittal structures are unremarkable. Sinuses/Orbits: The paranasal sinuses and mastoid air cells are clear. The globes and orbits are within normal limits. IMPRESSION: Negative MRI the brain. Electronically Signed   By: Marin Roberts M.D.   On: 03/12/2017 11:33    Microbiology: No results found for this or any previous visit (from  the past 240 hour(s)).   Labs: Basic Metabolic Panel:  Recent Labs Lab 03/12/17 0404 03/12/17 0926 03/13/17 0427  NA 140  --  136  K 3.4*  --  3.6  CL 107  --  107  CO2 25  --  22  GLUCOSE 105*  --  83  BUN 10  --  9  CREATININE 0.76  --  0.72  CALCIUM 9.2  --  8.3*  MG  --  1.8  --    Liver Function Tests:  Recent Labs Lab 03/12/17 0404  AST 25  ALT 13*  ALKPHOS 39  BILITOT 0.4  PROT 7.4  ALBUMIN 3.9   No results for input(s): LIPASE, AMYLASE in the last 168 hours. No results for input(s): AMMONIA in the last 168 hours. CBC:  Recent Labs Lab 03/12/17 0404 03/13/17 0427  WBC 3.1* 2.6*  NEUTROABS 1.5*  --   HGB 9.9* 9.4*  HCT 31.3* 30.0*  MCV 73.8* 75.2*  PLT 112* 103*   Cardiac Enzymes: No results for input(s): CKTOTAL, CKMB, CKMBINDEX, TROPONINI in the last 168 hours. BNP: BNP (last 3 results) No results for  input(s): BNP in the last 8760 hours.  ProBNP (last 3 results) No results for input(s): PROBNP in the last 8760 hours.  CBG:  Recent Labs Lab 03/12/17 0330  GLUCAP 127*

## 2017-03-13 NOTE — Progress Notes (Signed)
Progress Note  Patient Name: Nicole Benton Date of Encounter: 03/13/2017  Primary Cardiologist: None  Subjective   Bit more awake and communicative this am Getting EEG leads applied   Inpatient Medications    Scheduled Meds: . enoxaparin (LOVENOX) injection  40 mg Subcutaneous Q24H  . sodium chloride flush  3 mL Intravenous Q12H   Continuous Infusions: . sodium chloride     PRN Meds: sodium chloride, acetaminophen **OR** acetaminophen, ketorolac, ondansetron **OR** ondansetron (ZOFRAN) IV, sodium chloride flush, traMADol   Vital Signs    Vitals:   03/12/17 1500 03/12/17 1615 03/12/17 2119 03/13/17 0512  BP: 112/69 120/60 (!) 110/54 121/74  Pulse: 66 71 66 63  Resp: 19 20 18 16   Temp:  98.3 F (36.8 C) 98.3 F (36.8 C) 98.2 F (36.8 C)  TempSrc:  Oral Oral Oral  SpO2: 100% 100% 100% 100%  Weight:  157 lb 3 oz (71.3 kg)    Height:  5\' 3"  (1.6 m)      Intake/Output Summary (Last 24 hours) at 03/13/17 0857 Last data filed at 03/12/17 1800  Gross per 24 hour  Intake             1000 ml  Output                0 ml  Net             1000 ml   Filed Weights   03/12/17 1615  Weight: 157 lb 3 oz (71.3 kg)    Telemetry    NSR rare sinus exit block no long pauses - Personally Reviewed  ECG    NSR no acute ST changes  - Personally Reviewed  Physical Exam  Obese black female  GEN: No acute distress.   Neck: No JVD Cardiac: RRR, no murmurs, rubs, or gallops.  Respiratory: Clear to auscultation bilaterally. GI: Soft, nontender, non-distended  MS: No edema; No deformity. Neuro:  Nonfocal  Psych: Normal affect   Labs    Chemistry Recent Labs Lab 03/12/17 0404 03/13/17 0427  NA 140 136  K 3.4* 3.6  CL 107 107  CO2 25 22  GLUCOSE 105* 83  BUN 10 9  CREATININE 0.76 0.72  CALCIUM 9.2 8.3*  PROT 7.4  --   ALBUMIN 3.9  --   AST 25  --   ALT 13*  --   ALKPHOS 39  --   BILITOT 0.4  --   GFRNONAA >60 >60  GFRAA >60 >60  ANIONGAP 8 7      Hematology Recent Labs Lab 03/12/17 0404 03/12/17 1700 03/13/17 0427  WBC 3.1*  --  2.6*  RBC 4.24 4.29 3.99  HGB 9.9*  --  9.4*  HCT 31.3*  --  30.0*  MCV 73.8*  --  75.2*  MCH 23.3*  --  23.6*  MCHC 31.6  --  31.3  RDW 15.4  --  15.6*  PLT 112*  --  103*    Cardiac EnzymesNo results for input(s): TROPONINI in the last 168 hours. No results for input(s): TROPIPOC in the last 168 hours.   BNPNo results for input(s): BNP, PROBNP in the last 168 hours.   DDimer No results for input(s): DDIMER in the last 168 hours.   Radiology    Ct Head Wo Contrast  Result Date: 03/12/2017 CLINICAL DATA:  Diffuse headache.  Syncopal episode at 02:00. EXAM: CT HEAD WITHOUT CONTRAST TECHNIQUE: Contiguous axial images were obtained from the base of the  skull through the vertex without intravenous contrast. COMPARISON:  None. FINDINGS: Brain: There is no intracranial hemorrhage, mass or evidence of acute infarction. There is no extra-axial fluid collection. Gray matter and white matter appear normal. Cerebral volume is normal for age. Brainstem and posterior fossa are unremarkable. The CSF spaces appear normal. Vascular: No hyperdense vessel or unexpected calcification. Skull: Normal. Negative for fracture or focal lesion. Sinuses/Orbits: No acute finding. Other: None. IMPRESSION: Normal brain Electronically Signed   By: Ellery Plunkaniel R Mitchell M.D.   On: 03/12/2017 05:06   Mr Brain Wo Contrast  Result Date: 03/12/2017 CLINICAL DATA:  Altered level consciousness.  Syncopal episode. EXAM: MRI HEAD WITHOUT CONTRAST TECHNIQUE: Multiplanar, multiecho pulse sequences of the brain and surrounding structures were obtained without intravenous contrast. COMPARISON:  CT head without contrast from the same day. FINDINGS: Brain: No acute infarct, hemorrhage, or mass lesion is present. The ventricles are of normal size. No significant extraaxial fluid collection is present. No significant white matter disease is present.  The brainstem and cerebellum are normal. Vascular: Flow is present in the major intracranial arteries. Skull and upper cervical spine: The skullbase is within normal limits. The craniocervical junction is normal. Midline sagittal structures are unremarkable. Sinuses/Orbits: The paranasal sinuses and mastoid air cells are clear. The globes and orbits are within normal limits. IMPRESSION: Negative MRI the brain. Electronically Signed   By: Marin Robertshristopher  Mattern M.D.   On: 03/12/2017 11:33    Cardiac Studies   Echo :  Normal EF 60-65%  Patient Profile     21 y.o. female admitted with change in mental status starring / unresponsive episodes Tox screen negative CT/MRI negative Getting EEG now  Assessment & Plan    1) Arrhythmia:  Red herring rare sinus exit block not related to symptoms which were prolonged And continued despite NSR and normal hemodynamics in ER. No need for further w/u Echo  With no structural heart disease 2) MS:  Neuro following CT/MRI negative EEG being applied   Will sign off   Signed, Charlton HawsPeter Nishan, MD  03/13/2017, 8:57 AM

## 2017-03-13 NOTE — Progress Notes (Signed)
Offsite EEG completed at [facility], results pending. 

## 2017-03-29 ENCOUNTER — Ambulatory Visit: Payer: Self-pay | Admitting: Family Medicine

## 2018-05-13 ENCOUNTER — Ambulatory Visit (HOSPITAL_COMMUNITY)
Admission: EM | Admit: 2018-05-13 | Discharge: 2018-05-13 | Disposition: A | Discharge status: Home / Self Care | Payer: Managed Care, Other (non HMO) | Attending: Family Medicine | Admitting: Family Medicine

## 2018-05-13 ENCOUNTER — Encounter (HOSPITAL_COMMUNITY): Payer: Self-pay | Admitting: Emergency Medicine

## 2018-05-13 DIAGNOSIS — R21 Rash and other nonspecific skin eruption: Secondary | ICD-10-CM

## 2018-05-13 MED ORDER — TRIAMCINOLONE ACETONIDE 0.025 % EX OINT: 1.0000 "application " | TOPICAL_OINTMENT | Freq: Two times a day (BID) | CUTANEOUS | 0 refills | Status: AC

## 2018-05-13 NOTE — ED Triage Notes (Signed)
Pt here with rash to face with itching

## 2018-05-13 NOTE — ED Provider Notes (Signed)
[facility]    CSN: [id number] Arrival date & time: 05/13/18  1039     History   Chief Complaint Chief Complaint  Patient presents with  . Rash    HPI Nicole Benton is a 22 y.o. female.    Rash  Location:  Face Facial rash location:  Face Quality: dryness and itchiness   Quality: not blistering, not bruising, not burning, not draining, not painful, not peeling, not red, not scaling, not swelling and not weeping   Severity:  Moderate Onset quality:  Gradual Duration:  1 week Timing:  Constant Progression:  Unchanged Chronicity:  New Context: not animal contact, not chemical exposure, not diapers, not eggs, not exposure to similar rash, not food, not hot tub use, not insect bite/sting, not medications, not new detergent/soap, not nuts, not plant contact, not pollen, not pregnancy, not sick contacts and not sun exposure   Relieved by:  Nothing Worsened by:  Nothing Ineffective treatments:  None tried Associated symptoms: no abdominal pain, no diarrhea, no fatigue, no fever, no headaches, no hoarse voice, no induration, no joint pain, no myalgias, no nausea, no periorbital edema, no shortness of breath, no sore throat, no throat swelling, no tongue swelling, no URI, not vomiting and not wheezing     Past Medical History:  Diagnosis Date  . Syncope and collapse     Patient Active Problem List   Diagnosis Date Noted  . Altered level of consciousness 03/12/2017    History reviewed. No pertinent surgical history.  OB History   None      Home Medications    Prior to Admission medications   Medication Sig Start Date End Date Taking? Authorizing Provider  triamcinolone (KENALOG) 0.025 % ointment Apply 1 application topically 2 (two) times daily for 7 days. 05/13/18 05/20/18  Janace Aris, NP    Family History Family History  Problem Relation Age of Onset  . Hypertension Mother   . Heart disease Father   . Hypertension Father     Social  History Social History   Tobacco Use  . Smoking status: Never Smoker  . Smokeless tobacco: Never Used  Substance Use Topics  . Alcohol use: No  . Drug use: No     Allergies   Patient has no known allergies.   Review of Systems Review of Systems  Constitutional: Negative for fatigue and fever.  HENT: Negative for hoarse voice and sore throat.   Respiratory: Negative for shortness of breath and wheezing.   Gastrointestinal: Negative for abdominal pain, diarrhea, nausea and vomiting.  Musculoskeletal: Negative for arthralgias and myalgias.  Skin: Positive for rash.  Neurological: Negative for headaches.     Physical Exam Triage Vital Signs ED Triage Vitals  Enc Vitals Group     BP 05/13/18 1133 126/62     Pulse Rate 05/13/18 1133 81     Resp 05/13/18 1133 16     Temp 05/13/18 1133 98 F (36.7 C)     Temp Source 05/13/18 1133 Oral     SpO2 05/13/18 1133 100 %     Weight --      Height --      Head Circumference --      Peak Flow --      Pain Score 05/13/18 1135 2     Pain Loc --      Pain Edu? --      Excl. in GC? --    No data found.  Updated Vital Signs  BP 126/62 (BP Location: Right Arm)   Pulse 81   Temp 98 F (36.7 C) (Oral)   Resp 16   SpO2 100%   Visual Acuity Right Eye Distance:   Left Eye Distance:   Bilateral Distance:    Right Eye Near:   Left Eye Near:    Bilateral Near:     Physical Exam  Constitutional: She appears well-developed and well-nourished.  Very pleasant. Non toxic or ill appearing.   HENT:  Head: Normocephalic and atraumatic.  Eyes: Conjunctivae are normal.  Neck: Normal range of motion.  Pulmonary/Chest: Effort normal.  Musculoskeletal: Normal range of motion.  Neurological: She is alert.  Skin: Skin is warm and dry. Rash noted.  Diffuse papular rash to cheeks, forehead and chin.   Psychiatric: She has a normal mood and affect.  Nursing note and vitals reviewed.    UC Treatments / Results  Labs (all labs  ordered are listed, but only abnormal results are displayed) Labs Reviewed - No data to display  EKG None  Radiology No results found.  Procedures Procedures (including critical care time)  Medications Ordered in UC Medications - No data to display  Initial Impression / Assessment and Plan / UC Course  I have reviewed the triage vital signs and the nursing notes.  Pertinent labs & imaging results that were available during my care of the patient were reviewed by me and considered in my medical decision making (see chart for details).     Most likely contact dermatitis Will treat with low potency triamcinolone and have her follow up if not better in the next week.  Final Clinical Impressions(s) / UC Diagnoses   Final diagnoses:  None     Discharge Instructions     Will be prescribing some cream to put on your face. You  can do this twice a day for the next 5 to 7 days If not better or worse by next week please follow-up    ED Prescriptions    Medication Sig Dispense Auth. Provider   triamcinolone (KENALOG) 0.025 % ointment Apply 1 application topically 2 (two) times daily for 7 days. 14 g Dahlia Byes A, NP     Controlled Substance Prescriptions [place] Controlled Substance Registry consulted? Not Applicable   Janace Aris, NP 05/13/18 1223

## 2018-05-13 NOTE — Discharge Instructions (Addendum)
Will be prescribing some cream to put on your face. You  can do this twice a day for the next 5 to 7 days If not better or worse by next week please follow-up

## 2018-12-14 ENCOUNTER — Ambulatory Visit (HOSPITAL_COMMUNITY)
Admission: EM | Admit: 2018-12-14 | Discharge: 2018-12-14 | Disposition: A | Discharge status: Home / Self Care | Payer: Managed Care, Other (non HMO) | Attending: Family Medicine | Admitting: Family Medicine

## 2018-12-14 ENCOUNTER — Encounter (HOSPITAL_COMMUNITY): Payer: Self-pay | Admitting: Family Medicine

## 2018-12-14 ENCOUNTER — Ambulatory Visit (INDEPENDENT_AMBULATORY_CARE_PROVIDER_SITE_OTHER): Payer: Managed Care, Other (non HMO)

## 2018-12-14 ENCOUNTER — Other Ambulatory Visit: Payer: Self-pay

## 2018-12-14 DIAGNOSIS — M25572 Pain in left ankle and joints of left foot: Secondary | ICD-10-CM | POA: Diagnosis not present

## 2018-12-14 MED ORDER — METHYLPREDNISOLONE 4 MG PO TABS: 4.0000 mg | ORAL_TABLET | Freq: Two times a day (BID) | ORAL | 1 refills | Status: DC

## 2018-12-14 NOTE — ED Provider Notes (Signed)
[facility]    CSN: [id number] Arrival date & time: 12/14/18  1608     History   Chief Complaint Chief Complaint  Patient presents with  . Ankle Pain    HPI Nicole Benton is a 23 y.o. female.   [facility]ablished [facility] patient  Patient presents with left ankle pain.  This began a week ago and there was no known injury.  Instead, she spontaneously started having internal left ankle pain with weight bearing.  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  No fever.  No swelling of the ankle.     Past Medical History:  Diagnosis Date  . Syncope and collapse     Patient Active Problem List   Diagnosis Date Noted  . Altered level of consciousness 03/12/2017    History reviewed. No pertinent surgical history.  OB History   No obstetric history on file.      Home Medications    Prior to Admission medications   Medication Sig Start Date End Date Taking? Authorizing Provider  methylPREDNISolone (MEDROL) 4 MG tablet Take 1 tablet (4 mg total) by mouth 2 (two) times daily. 12/14/18   Elvina Sidle, MD    Family History Family History  Problem Relation Age of Onset  . Hypertension Mother   . Heart disease Father   . Hypertension Father     Social History Social History   Tobacco Use  . Smoking status: Never Smoker  . Smokeless tobacco: Never Used  Substance Use Topics  . Alcohol use: No  . Drug use: No     Allergies   Patient has no known allergies.   Review of Systems Review of Systems  Musculoskeletal: Positive for gait problem.  All other systems reviewed and are negative.    Physical Exam Triage Vital Signs ED Triage Vitals  Enc Vitals Group     BP      Pulse      Resp      Temp      Temp src      SpO2      Weight      Height      Head Circumference      Peak Flow      Pain Score      Pain Loc      Pain Edu?      Excl. in GC?    No data found.  Updated Vital Signs BP 128/66 (BP  Location: Right Arm)   Pulse 97   Temp 98 F (36.7 C) (Oral)   Resp 18   LMP 11/23/2018   SpO2 97%    Physical Exam Vitals signs and nursing note reviewed.  Constitutional:      Appearance: Normal appearance. She is normal weight.  HENT:     Mouth/Throat:     Pharynx: Oropharynx is clear.  Eyes:     Conjunctiva/sclera: Conjunctivae normal.  Neck:     Musculoskeletal: Normal range of motion and neck supple.  Pulmonary:     Effort: Pulmonary effort is normal.  Musculoskeletal:        General: No swelling, tenderness, deformity or signs of injury.     Comments: Left ankle appear normal.  There is no localized tenderness.  With extreme plantar flexion or lateral passive ROM patient experiences some sharp pain.  Skin:    General: Skin is warm and dry.  Neurological:     General: No focal  deficit present.     Mental Status: She is alert.      UC Treatments / Results  Labs (all labs ordered are listed, but only abnormal results are displayed) Labs Reviewed - No data to display  EKG None  Radiology  left ankle films show no bony abnormality Procedures Procedures (including critical care time)  Medications Ordered in UC Medications - No data to display  Initial Impression / Assessment and Plan / UC Course  I have reviewed the triage vital signs and the nursing notes.  Pertinent labs & imaging results that were available during my care of the patient were reviewed by me and considered in my medical decision making (see chart for details).     Final Clinical Impressions(s) / UC Diagnoses   Final diagnoses:  Acute left ankle pain     Discharge Instructions     Take the methyl prednisolone as directed.  Gently move the ankle through the full range of motion several times a day.    ED Prescriptions    Medication Sig Dispense Auth. Provider   methylPREDNISolone (MEDROL) 4 MG tablet Take 1 tablet (4 mg total) by mouth 2 (two) times daily. 20 tablet Elvina SidleLauenstein,  Terissa Haffey, MD     Controlled Substance Prescriptions [place] Controlled Substance Registry consulted? Not Applicable   Elvina SidleLauenstein, Averyanna Sax, MD 12/14/18 1723

## 2018-12-14 NOTE — ED Triage Notes (Signed)
Pt sts left ankle pain x 2 days; denies obvious injury

## 2018-12-14 NOTE — Discharge Instructions (Addendum)
Take the methyl prednisolone as directed.  Gently move the ankle through the full range of motion several times a day.

## 2019-01-22 ENCOUNTER — Emergency Department (HOSPITAL_COMMUNITY): Payer: Managed Care, Other (non HMO)

## 2019-01-22 ENCOUNTER — Emergency Department (HOSPITAL_COMMUNITY)
Admission: EM | Admit: 2019-01-22 | Discharge: 2019-01-22 | Disposition: A | Discharge status: Home / Self Care | Payer: Managed Care, Other (non HMO) | Attending: Emergency Medicine | Admitting: Emergency Medicine

## 2019-01-22 DIAGNOSIS — R42 Dizziness and giddiness: Secondary | ICD-10-CM | POA: Insufficient documentation

## 2019-01-22 DIAGNOSIS — R51 Headache: Secondary | ICD-10-CM | POA: Insufficient documentation

## 2019-01-22 DIAGNOSIS — R55 Syncope and collapse: Secondary | ICD-10-CM | POA: Insufficient documentation

## 2019-01-22 LAB — CBC WITH DIFFERENTIAL/PLATELET
Abs Immature Granulocytes: 0.01 10*3/uL (ref 0.00–0.07)
Basophils Absolute: 0 10*3/uL (ref 0.0–0.1)
Basophils Relative: 0 %
Eosinophils Absolute: 0.1 10*3/uL (ref 0.0–0.5)
Eosinophils Relative: 2 %
HCT: 32.8 % — ABNORMAL LOW (ref 36.0–46.0)
Hemoglobin: 9.8 g/dL — ABNORMAL LOW (ref 12.0–15.0)
Immature Granulocytes: 0 %
Lymphocytes Relative: 29 %
Lymphs Abs: 1.4 10*3/uL (ref 0.7–4.0)
MCH: 22.8 pg — ABNORMAL LOW (ref 26.0–34.0)
MCHC: 29.9 g/dL — ABNORMAL LOW (ref 30.0–36.0)
MCV: 76.5 fL — ABNORMAL LOW (ref 80.0–100.0)
Monocytes Absolute: 0.4 10*3/uL (ref 0.1–1.0)
Monocytes Relative: 9 %
Neutro Abs: 2.8 10*3/uL (ref 1.7–7.7)
Neutrophils Relative %: 60 %
Platelets: 243 10*3/uL (ref 150–400)
RBC: 4.29 MIL/uL (ref 3.87–5.11)
RDW: 15.2 % (ref 11.5–15.5)
WBC: 4.7 10*3/uL (ref 4.0–10.5)
nRBC: 0 % (ref 0.0–0.2)

## 2019-01-22 LAB — I-STAT BETA HCG BLOOD, ED (MC, WL, AP ONLY): I-stat hCG, quantitative: <5 m[IU]/mL (ref <5)

## 2019-01-22 LAB — BASIC METABOLIC PANEL
Anion gap: 11 (ref 5–15)
BUN: 10 mg/dL (ref 6–20)
CO2: 20 mmol/L — ABNORMAL LOW (ref 22–32)
Calcium: 9 mg/dL (ref 8.9–10.3)
Chloride: 104 mmol/L (ref 98–111)
Creatinine, Ser: 0.82 mg/dL (ref 0.44–1.00)
GFR calc Af Amer: >60 mL/min (ref >60)
GFR calc non Af Amer: >60 mL/min (ref >60)
Glucose, Bld: 93 mg/dL (ref 70–99)
Potassium: 3.7 mmol/L (ref 3.5–5.1)
Sodium: 135 mmol/L (ref 135–145)

## 2019-01-22 MED ORDER — SODIUM CHLORIDE 0.9 % IV BOLUS
1000.0000 mL | Freq: Once | INTRAVENOUS | Status: AC
  Administered 2019-01-22: 1000 mL via INTRAVENOUS

## 2019-01-22 MED ORDER — KETOROLAC TROMETHAMINE 30 MG/ML IJ SOLN
30.0000 mg | Freq: Once | INTRAMUSCULAR | Status: AC
  Administered 2019-01-22: 30 mg via INTRAVENOUS
  Filled 2019-01-22: qty 1

## 2019-01-22 MED ORDER — METOCLOPRAMIDE HCL 5 MG/ML IJ SOLN
10.0000 mg | Freq: Once | INTRAMUSCULAR | Status: AC
  Administered 2019-01-22: 10 mg via INTRAVENOUS
  Filled 2019-01-22: qty 2

## 2019-01-22 NOTE — Discharge Instructions (Signed)
Make sure to stay well-hydrated.  Please follow-up with a neurologist and cardiologist below for further evaluation and treatment of your episodes of passing out shaking.  Please return the emergency department he develop any new or worsening symptoms peer

## 2019-01-22 NOTE — ED Triage Notes (Signed)
Came in via EMS; reported syncope witnessed by co-workers; reported works at [employer] like settings. EMS reported witnessed a "convulsion / pseudoseizure type" episode. Denies PMH and no medications at home

## 2019-01-22 NOTE — ED Provider Notes (Signed)
[place] EMERGENCY DEPARTMENT Provider Note   CSN: [id number] Arrival date & time: 01/22/19  1647    History   Chief Complaint Chief Complaint  Patient presents with  . Near Syncope    HPI Nicole Benton is a 23 y.o. female with history of syncope who presents for evaluation after syncopal episode.  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  After that, she does not remember what happened. Patient reported headache when she came in, but otherwise no pain.   Per EMS, patient apparently had a convulsion/pseudoseizure-like event.  Per chart review, patient has had these in the past and was evaluated by neurology during her admission.  She had negative MRI.  Neurology recommended outpatient neurology follow-up as well as cardiology follow-up.  Patient has not followed up with either.  Patient is from [place] prior to moving, this happened 3 other times.  It has happened twice now in the past couple years.  She is feeling mostly back to baseline now other than a headache.  She denies any fever, cough, chest pain, shortness of breath, abdominal pain, nausea, vomiting.  She reports she has not been sleeping much lately because she has been taking care of her sick father.     HPI  Past Medical History:  Diagnosis Date  . Syncope and collapse     Patient Active Problem List   Diagnosis Date Noted  . Altered level of consciousness 03/12/2017    No past surgical history on file.   OB History   No obstetric history on file.      Home Medications    Prior to Admission medications   Medication Sig Start Date End Date Taking? Authorizing Provider  methylPREDNISolone (MEDROL) 4 MG tablet Take 1 tablet (4 mg total) by mouth 2 (two) times daily. 12/14/18   Robyn Haber, MD    Family History Family History  Problem Relation Age of Onset  . Hypertension Mother   . Heart disease Father   .  Hypertension Father     Social History Social History   Tobacco Use  . Smoking status: Never Smoker  . Smokeless tobacco: Never Used  Substance Use Topics  . Alcohol use: No  . Drug use: No     Allergies   Patient has no known allergies.   Review of Systems Review of Systems  Constitutional: Negative for chills and fever.  HENT: Negative for facial swelling and sore throat.   Respiratory: Negative for shortness of breath.   Cardiovascular: Negative for chest pain.  Gastrointestinal: Negative for abdominal pain, nausea and vomiting.  Genitourinary: Negative for dysuria.  Musculoskeletal: Negative for back pain.  Skin: Negative for rash and wound.  Neurological: Positive for dizziness, syncope and headaches.  Psychiatric/Behavioral: The patient is not nervous/anxious.      Physical Exam Updated Vital Signs BP 114/65   Pulse 75   Temp 98.9 F (37.2 C) (Oral)   Resp 16   SpO2 100%   Physical Exam Vitals signs and nursing note reviewed.  Constitutional:      General: She is not in acute distress.    Appearance: She is well-developed. She is not diaphoretic.  HENT:     Head: Normocephalic and atraumatic.     Mouth/Throat:     Pharynx: No oropharyngeal exudate.  Eyes:     General: No scleral icterus.       Right eye: No discharge.  Left eye: No discharge.     Extraocular Movements: Extraocular movements intact.     Conjunctiva/sclera: Conjunctivae normal.     Pupils: Pupils are equal, round, and reactive to light.  Neck:     Musculoskeletal: Normal range of motion and neck supple.     Thyroid: No thyromegaly.  Cardiovascular:     Rate and Rhythm: Normal rate and regular rhythm.     Heart sounds: Normal heart sounds. No murmur. No friction rub. No gallop.   Pulmonary:     Effort: Pulmonary effort is normal. No respiratory distress.     Breath sounds: Normal breath sounds. No stridor. No wheezing or rales.  Abdominal:     General: Bowel sounds are  normal. There is no distension.     Palpations: Abdomen is soft.     Tenderness: There is no abdominal tenderness. There is no guarding or rebound.  Lymphadenopathy:     Cervical: No cervical adenopathy.  Skin:    General: Skin is warm and dry.     Coloration: Skin is not pale.     Findings: No rash.  Neurological:     Mental Status: She is alert.     Coordination: Coordination normal.     Comments: CN 3-12 intact; normal sensation throughout; 5/5 strength in all 4 extremities; equal bilateral grip strength      ED Treatments / Results  Labs (all labs ordered are listed, but only abnormal results are displayed) Labs Reviewed  BASIC METABOLIC PANEL - Abnormal; Notable for the following components:      Result Value   CO2 20 (*)    All other components within normal limits  CBC WITH DIFFERENTIAL/PLATELET - Abnormal; Notable for the following components:   Hemoglobin 9.8 (*)    HCT 32.8 (*)    MCV 76.5 (*)    MCH 22.8 (*)    MCHC 29.9 (*)    All other components within normal limits  I-STAT BETA HCG BLOOD, ED ([facility], [facility], [facility] ONLY)    EKG None  Radiology Ct Head Wo Contrast  Result Date: 01/22/2019 CLINICAL DATA:  Recent syncopal episode EXAM: CT HEAD WITHOUT CONTRAST TECHNIQUE: Contiguous axial images were obtained from the base of the skull through the vertex without intravenous contrast. COMPARISON:  03/12/2017 FINDINGS: Brain: No evidence of acute infarction, hemorrhage, hydrocephalus, extra-axial collection or mass lesion/mass effect. Vascular: No hyperdense vessel or unexpected calcification. Skull: Normal. Negative for fracture or focal lesion. Sinuses/Orbits: No acute finding. Other: None. IMPRESSION: Normal head CT, stable from the prior exam. Electronically Signed   By: Alcide CleverMark  Lukens M.D.   On: 01/22/2019 17:58    Procedures Procedures (including critical care time)  Medications Ordered in ED Medications  sodium chloride 0.9 % bolus 1,000 mL (1,000 mLs Intravenous New  Bag/Given 01/22/19 1806)  metoCLOPramide (REGLAN) injection 10 mg (10 mg Intravenous Given 01/22/19 1806)  ketorolac (TORADOL) 30 MG/ML injection 30 mg (30 mg Intravenous Given 01/22/19 1846)     Initial Impression / Assessment and Plan / ED Course  I have reviewed the triage vital signs and the nursing notes.  Pertinent labs & imaging results that were available during my care of the patient were reviewed by me and considered in my medical decision making (see chart for details).  Clinical Course as of Jan 21 2029  Thu Jan 22, 2019  2029 Ecg - nsr, nl intervals, rate 76, no acute st/ts   [MB]    Clinical Course User Index [MB] Charm BargesButler,  Kayleen MemosMichael C, MD       Patient presenting following episode of syncope and pseudoseizure-like convulsing, per EMS.  Patient is back to baseline and feeling much better after IV fluids.  She was not orthostatic.  Labs are stable for the patient.  Patient has been seen and admitted for the same thing 2 years ago.  Neurology saw her at that point an MRI was negative.  She was discharged and recommended to follow-up with neurology as well as cardiology, considering an arrhythmia seen previously.  She has not followed up with these.  EKG shows normal sinus rhythm today and no arrhythmia.  hCG negative.  Feel patient is safe for discharge home with outpatient follow-up considering similar symptoms from previous 4 episodes, including an admission for the last 1.  Patient advised to stay well-hydrated and get plenty of sleep.  Return precautions discussed.  Patient understands and agrees with plan.  Patient vitals stable throughout ED course and discharged in satisfactory condition.  Final Clinical Impressions(s) / ED Diagnoses   Final diagnoses:  Syncope and collapse    ED Discharge Orders    None       Emi HolesLaw, Jacorie Ernsberger M, PA-C 01/22/19 2030    Terrilee FilesButler, Michael C, MD 01/23/19 1005

## 2019-01-22 NOTE — ED Notes (Signed)
Pt able to make needs known. Verbalized needing to use the restroom. Pt. Ambulated with no issues.

## 2019-01-23 LAB — CBG MONITORING, ED: Glucose-Capillary: 73 mg/dL (ref 70–99)

## 2019-03-18 ENCOUNTER — Encounter (HOSPITAL_COMMUNITY): Payer: Self-pay | Admitting: Emergency Medicine

## 2019-03-18 ENCOUNTER — Other Ambulatory Visit: Payer: Self-pay

## 2019-03-18 ENCOUNTER — Emergency Department (HOSPITAL_COMMUNITY)
Admission: EM | Admit: 2019-03-18 | Discharge: 2019-03-18 | Disposition: A | Discharge status: Home / Self Care | Payer: Managed Care, Other (non HMO) | Attending: Emergency Medicine | Admitting: Emergency Medicine

## 2019-03-18 DIAGNOSIS — Z79899 Other long term (current) drug therapy: Secondary | ICD-10-CM | POA: Diagnosis not present

## 2019-03-18 DIAGNOSIS — L259 Unspecified contact dermatitis, unspecified cause: Secondary | ICD-10-CM | POA: Insufficient documentation

## 2019-03-18 DIAGNOSIS — R21 Rash and other nonspecific skin eruption: Secondary | ICD-10-CM | POA: Diagnosis present

## 2019-03-18 DIAGNOSIS — L309 Dermatitis, unspecified: Secondary | ICD-10-CM

## 2019-03-18 MED ORDER — TRIAMCINOLONE ACETONIDE 0.025 % EX OINT: 1.0000 "application " | TOPICAL_OINTMENT | Freq: Two times a day (BID) | CUTANEOUS | 0 refills | Status: DC

## 2019-03-18 NOTE — ED Triage Notes (Signed)
Pt here for a rash on her face for three days. Pt states it itches. Unsure of what caused this rash.

## 2019-03-18 NOTE — Discharge Instructions (Addendum)
Please read attached information. If you experience any new or worsening signs or symptoms please return to the emergency room for evaluation. Please follow-up with your primary care provider or specialist as discussed. Please use medication prescribed only as directed and discontinue taking if you have any concerning signs or symptoms.   °

## 2019-03-18 NOTE — ED Provider Notes (Signed)
[facility]   CSN: [id number] Arrival date & time: 03/18/19  1056     History   Chief Complaint No chief complaint on file.   HPI Arlan Organugenia Andrews Commey is a 23 y.o. female.     HPI   23 year old female presents today with a rash to her face.  Patient notes symptoms started 3 days ago with itchy rash to her face.  Patient notes she had this previously and was put on triamcinolone which resolved her symptoms.  She denies any new exposures, no new facial products, no new medications food or drink.  She is uncertain why she has this.  He denies any intraoral involvement, no respiratory symptoms.  No medications prior to arrival.  She is not pregnant or breast-feeding.  Past Medical History:  Diagnosis Date  . Syncope and collapse     Patient Active Problem List   Diagnosis Date Noted  . Altered level of consciousness 03/12/2017    History reviewed. No pertinent surgical history.   OB History   No obstetric history on file.      Home Medications    Prior to Admission medications   Medication Sig Start Date End Date Taking? Authorizing Provider  methylPREDNISolone (MEDROL) 4 MG tablet Take 1 tablet (4 mg total) by mouth 2 (two) times daily. 12/14/18   Elvina SidleLauenstein, Kurt, MD  triamcinolone (KENALOG) 0.025 % ointment Apply 1 application topically 2 (two) times daily. 03/18/19   Eyvonne MechanicHedges, Luciel Brickman, PA-C    Family History Family History  Problem Relation Age of Onset  . Hypertension Mother   . Heart disease Father   . Hypertension Father     Social History Social History   Tobacco Use  . Smoking status: Never Smoker  . Smokeless tobacco: Never Used  Substance Use Topics  . Alcohol use: No  . Drug use: No     Allergies   Patient has no known allergies.   Review of Systems Review of Systems  All other systems reviewed and are negative.   Physical Exam Updated Vital Signs BP 122/74 (BP Location: Right Arm)    Pulse 80   Temp 99.4 F (37.4 C) (Oral)   Resp 18   Wt 81.6 kg   SpO2 100%   BMI 31.89 kg/m   Physical Exam Vitals signs and nursing note reviewed.  Constitutional:      Appearance: She is well-developed.  HENT:     Head: Normocephalic and atraumatic.     Comments: No intraoral lesions or edema Eyes:     General: No scleral icterus.       Right eye: No discharge.        Left eye: No discharge.     Conjunctiva/sclera: Conjunctivae normal.     Pupils: Pupils are equal, round, and reactive to light.  Neck:     Musculoskeletal: Normal range of motion.     Vascular: No JVD.     Trachea: No tracheal deviation.  Pulmonary:     Effort: Pulmonary effort is normal.     Breath sounds: No stridor.  Skin:    Comments: Erythematous papular rash to the face, no vesicles  Neurological:     Mental Status: She is alert and oriented to person, place, and time.     Coordination: Coordination normal.  Psychiatric:        Behavior: Behavior normal.        Thought Content: Thought content normal.  Judgment: Judgment normal.      ED Treatments / Results  Labs (all labs ordered are listed, but only abnormal results are displayed) Labs Reviewed - No data to display  EKG None  Radiology No results found.  Procedures Procedures (including critical care time)  Medications Ordered in ED Medications - No data to display   Initial Impression / Assessment and Plan / ED Course  I have reviewed the triage vital signs and the nursing notes.  Pertinent labs & imaging results that were available during my care of the patient were reviewed by me and considered in my medical decision making (see chart for details).        23 year old female presents today with dermatitis to her face.  Uncertain etiology.  She has used triamcinolone cream in the past which resolved her symptoms.  I do find it reasonable to provide her a refill of this medication she understands the risks of using  steroids on the face.  Discharged with return precautions.  She verbalized understanding and agreement to today's plan.  Final Clinical Impressions(s) / ED Diagnoses   Final diagnoses:  Dermatitis    ED Discharge Orders         Ordered    triamcinolone (KENALOG) 0.025 % ointment  2 times daily     03/18/19 1200           Okey Regal, PA-C 03/18/19 1200    Gareth Morgan, MD 03/19/19 2300

## 2019-06-15 ENCOUNTER — Emergency Department (HOSPITAL_COMMUNITY)
Admission: EM | Admit: 2019-06-15 | Discharge: 2019-06-15 | Disposition: A | Discharge status: Home / Self Care | Payer: Managed Care, Other (non HMO) | Attending: Emergency Medicine | Admitting: Emergency Medicine

## 2019-06-15 ENCOUNTER — Encounter (HOSPITAL_COMMUNITY): Payer: Self-pay | Admitting: Emergency Medicine

## 2019-06-15 ENCOUNTER — Other Ambulatory Visit: Payer: Self-pay

## 2019-06-15 DIAGNOSIS — R21 Rash and other nonspecific skin eruption: Secondary | ICD-10-CM | POA: Insufficient documentation

## 2019-06-15 DIAGNOSIS — Z5321 Procedure and treatment not carried out due to patient leaving prior to being seen by health care provider: Secondary | ICD-10-CM | POA: Insufficient documentation

## 2019-06-15 NOTE — ED Triage Notes (Signed)
Pt. Stated, Nicole Benton had a rash on my face with itching and some pain . Came here about a week I have a cream but not better. Here again.

## 2019-06-15 NOTE — ED Notes (Signed)
Pt leaving AMA and states will return tomorrow. Pt told we would like her to stay and get treatment but pt leaves AMA instead.

## 2020-01-06 ENCOUNTER — Other Ambulatory Visit: Payer: Self-pay

## 2020-01-06 ENCOUNTER — Ambulatory Visit (HOSPITAL_COMMUNITY)
Admission: EM | Admit: 2020-01-06 | Discharge: 2020-01-06 | Disposition: A | Discharge status: Home / Self Care | Payer: Managed Care, Other (non HMO) | Attending: Family Medicine | Admitting: Family Medicine

## 2020-01-06 ENCOUNTER — Encounter (HOSPITAL_COMMUNITY): Payer: Self-pay

## 2020-01-06 DIAGNOSIS — L309 Dermatitis, unspecified: Secondary | ICD-10-CM | POA: Diagnosis not present

## 2020-01-06 MED ORDER — TRIAMCINOLONE ACETONIDE 0.025 % EX OINT: 1.0000 "application " | TOPICAL_OINTMENT | Freq: Two times a day (BID) | CUTANEOUS | 1 refills | Status: DC

## 2020-01-06 NOTE — ED Provider Notes (Signed)
  [facility]   [id number] 01/06/20 Arrival Time: 1043  ASSESSMENT & PLAN:  1. Dermatitis     Meds ordered this encounter  Medications  . triamcinolone (KENALOG) 0.025 % ointment    Sig: Apply 1 application topically 2 (two) times daily.    Dispense:  30 g    Refill:  1   Will follow up with PCP or here if worsening or failing to improve as anticipated. Reviewed expectations re: course of current medical issues. Questions answered. Outlined signs and symptoms indicating need for more acute intervention. Patient verbalized understanding. After Visit Summary given.   SUBJECTIVE:  Nicole Benton is a 24 y.o. female who presents with a skin complaint. Reports that she gets a recurrent rash on her face 2-3 x per year that has only responded to triamcinolone ointment. Requests refill. Understands that prolonged use may discolor skin. Otherwise well.   OBJECTIVE: Vitals:   01/06/20 1111 01/06/20 1113  BP:  108/64  Pulse:  77  Resp:  18  Temp:  98.4 F (36.9 C)  TempSrc:  Oral  SpO2:  100%  Weight: 86.2 kg     General appearance: alert; no distress HEENT: [place]; AT Skin: warm and dry; no facial rash Psychological: alert and cooperative; normal mood and affect  No Known Allergies  Past Medical History:  Diagnosis Date  . Syncope and collapse    Social History   Socioeconomic History  . Marital status: Single    Spouse name: Not on file  . Number of children: Not on file  . Years of education: Not on file  . Highest education level: Not on file  Occupational History  . Not on file  Tobacco Use  . Smoking status: Never Smoker  . Smokeless tobacco: Never Used  Vaping Use  . Vaping Use: Never used  Substance and Sexual Activity  . Alcohol use: No  . Drug use: No  . Sexual activity: Yes    Birth control/protection: None  Other Topics Concern  . Not on file  Social History Narrative  . Not on file   Social Determinants of Health    Financial Resource Strain:   . Difficulty of Paying Living Expenses:   Food Insecurity:   . Worried About Programme researcher, broadcasting/film/video in the Last Year:   . Barista in the Last Year:   Transportation Needs:   . Freight forwarder (Medical):   Marland Kitchen Lack of Transportation (Non-Medical):   Physical Activity:   . Days of Exercise per Week:   . Minutes of Exercise per Session:   Stress:   . Feeling of Stress :   Social Connections:   . Frequency of Communication with Friends and Family:   . Frequency of Social Gatherings with Friends and Family:   . Attends Religious Services:   . Active Member of Clubs or Organizations:   . Attends Banker Meetings:   Marland Kitchen Marital Status:   Intimate Partner Violence:   . Fear of Current or Ex-Partner:   . Emotionally Abused:   Marland Kitchen Physically Abused:   . Sexually Abused:    Family History  Problem Relation Age of Onset  . Hypertension Mother   . Heart disease Father   . Hypertension Father    History reviewed. No pertinent surgical history.   Mardella Layman, MD 01/06/20 1231

## 2020-01-06 NOTE — ED Triage Notes (Signed)
Pt is here with a rash on her face that started Friday, pt has not taken anything relieve discomfort. Pt states it itches, pt had this same rash last year.

## 2020-04-05 IMAGING — CT CT HEAD WITHOUT CONTRAST
4 series · 16 of 47 positions shown, 18 images · non-contrast
Comparison: 03/12/2017

CLINICAL DATA: Recent syncopal episode

EXAM:
CT HEAD WITHOUT CONTRAST
TECHNIQUE: Contiguous axial images were obtained from the base of the skull
through the vertex without intravenous contrast.

[Series 3: head wo · axial · 0.42mm/px · z∈[-142,-22]mm · 7 of 34 slices shown, 9 images]
[im 5/34  brain]
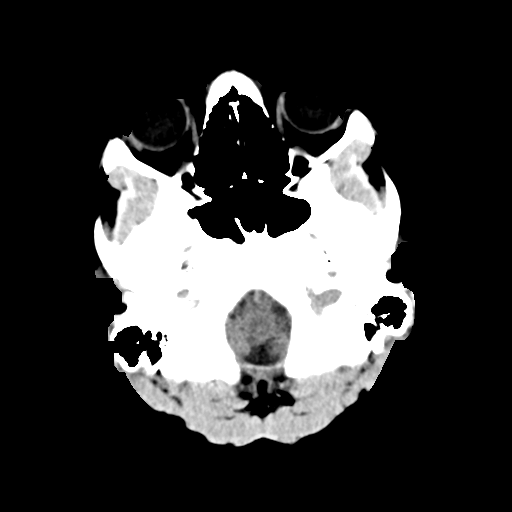
[im 5/34  bone]
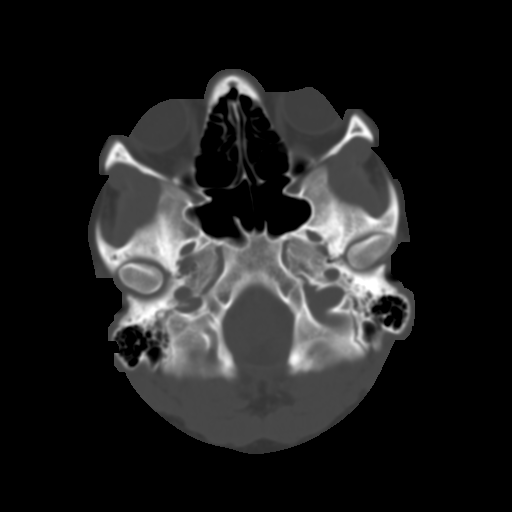
[im 9/34  brain]
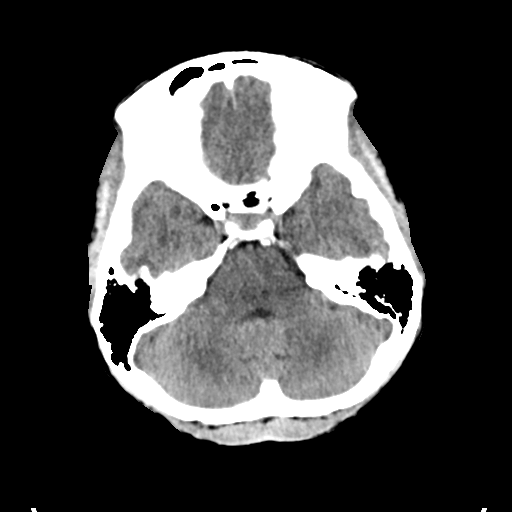
[im 13/34  brain]
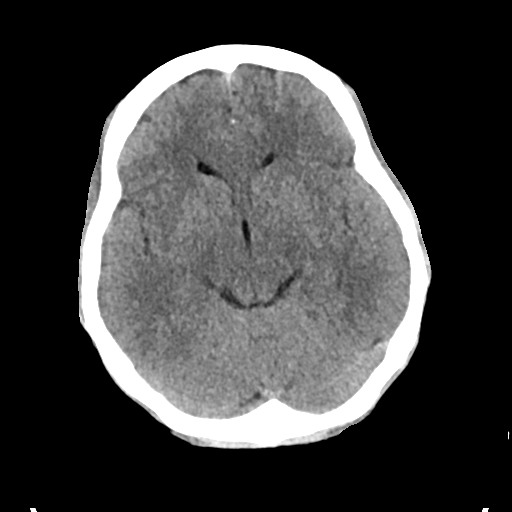
[im 17/34  brain]
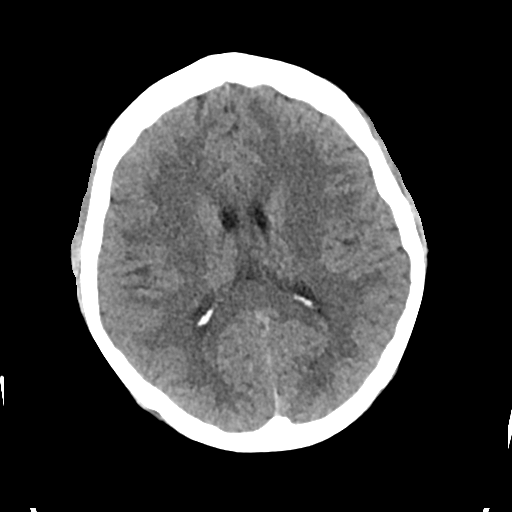
[im 21/34  brain]
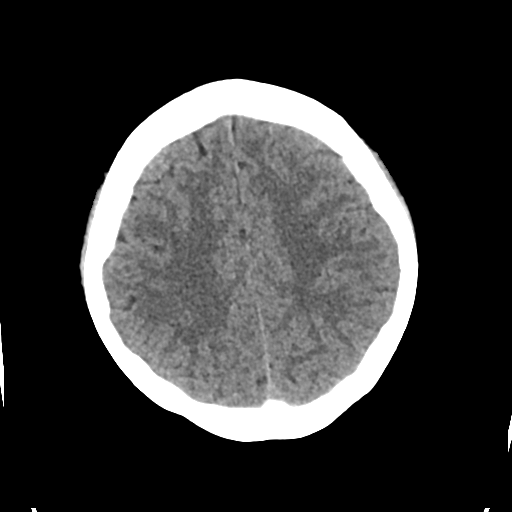
[im 21/34  bone]
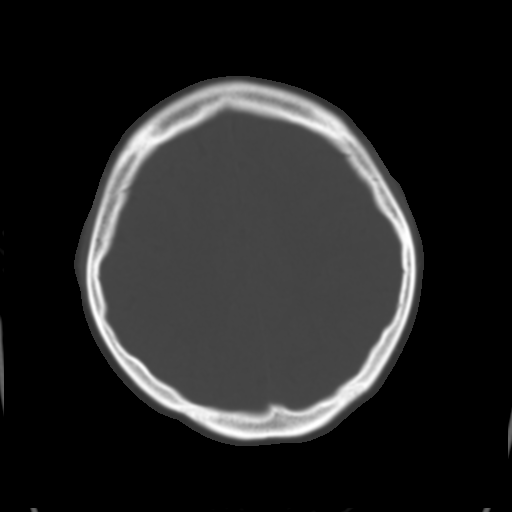
[im 25/34  brain]
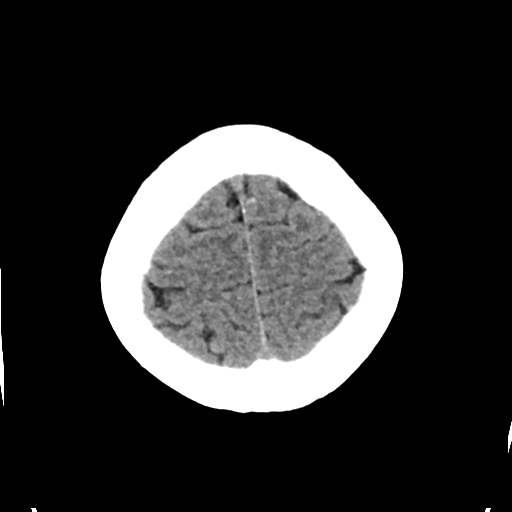
[im 29/34  brain]
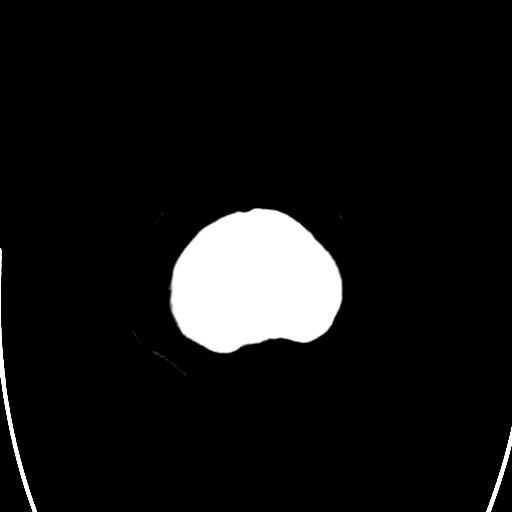

[Series 4: head bone · axial · 0.42mm/px · z∈[-146,-114]mm · 3 of 83 slices shown]
[im 9/83  bone]
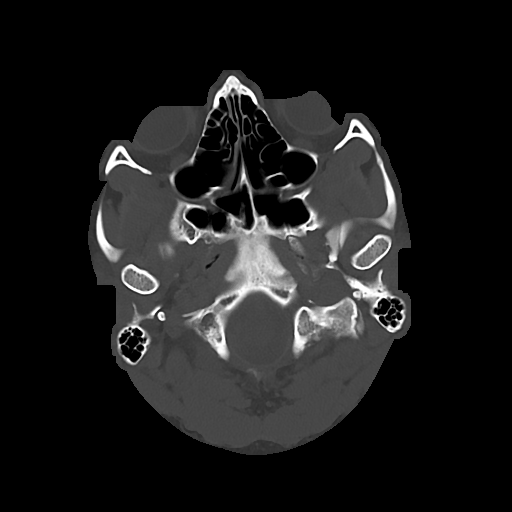
[im 17/83  bone]
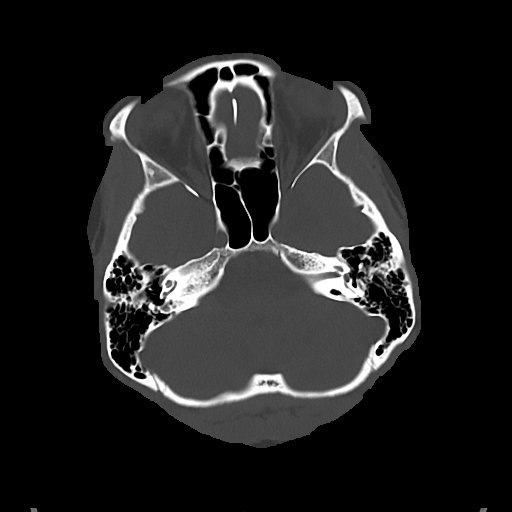
[im 25/83  bone]
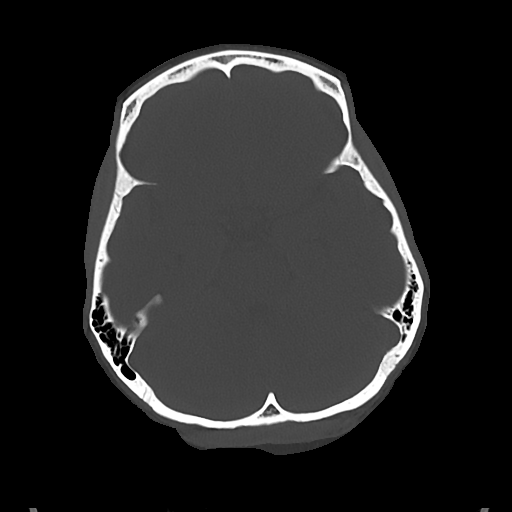

[Series 5: cor soft · coronal · 0.35mm/px · 3 of 67 slices shown]
[im 23/67  brain]
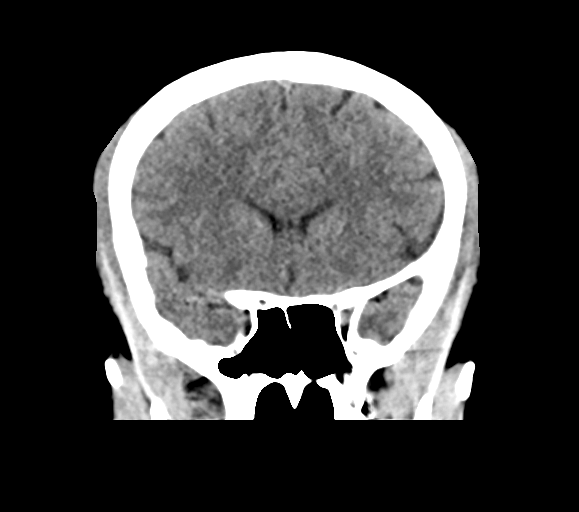
[im 30/67  brain]
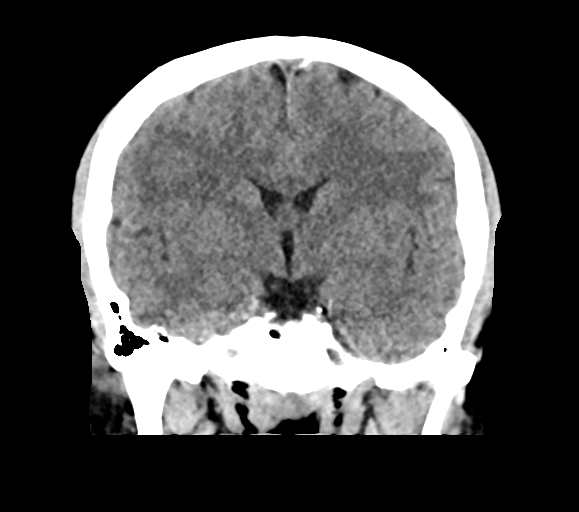
[im 37/67  brain]
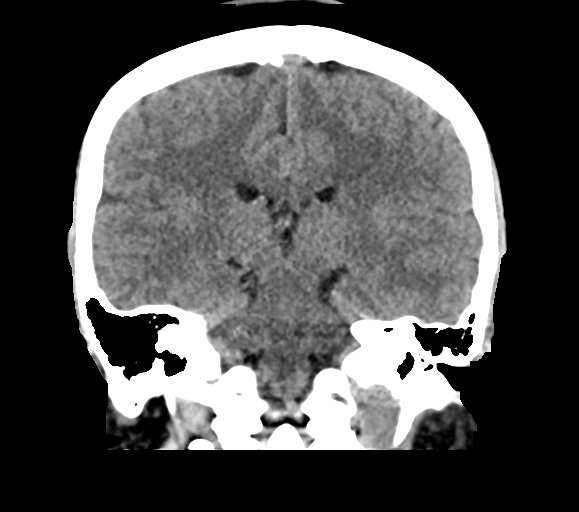

[Series 6: sag soft · sagittal · 0.32mm/px · 3 of 67 slices shown]
[im 23/67  brain]
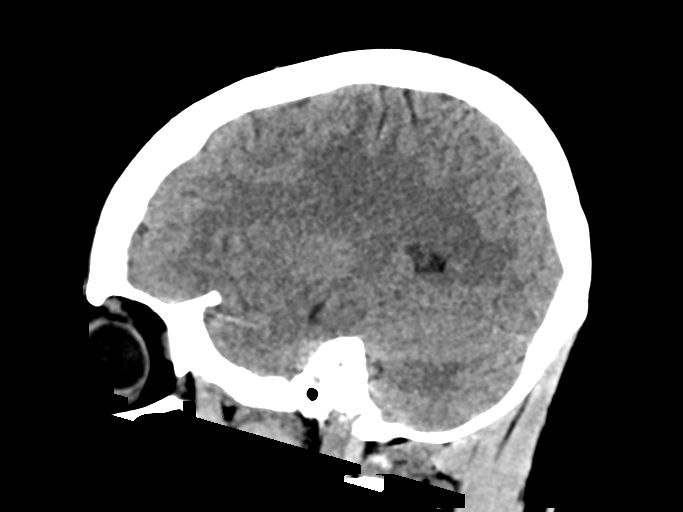
[im 34/67  brain]
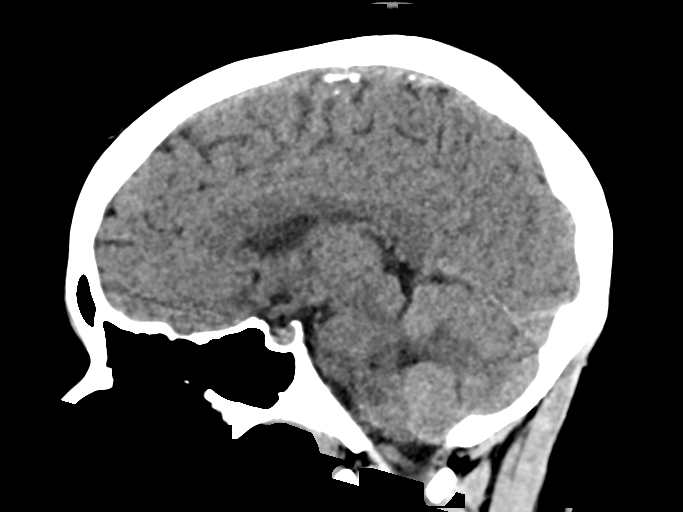
[im 45/67  brain]
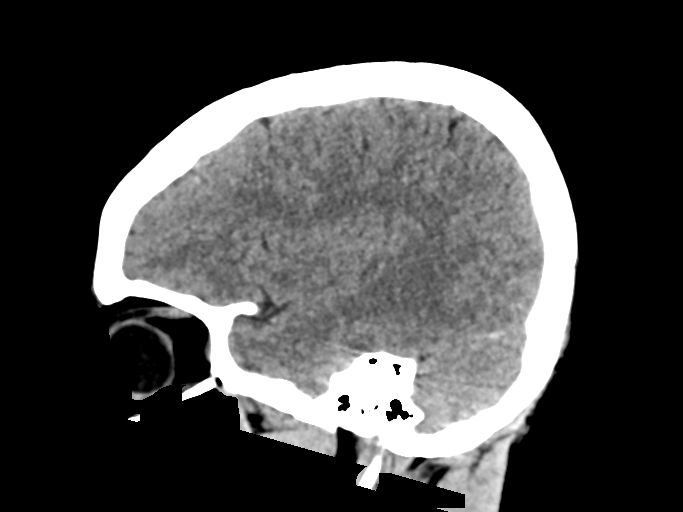

[16 of 47 positions shown; findings below may reference images not displayed]

FINDINGS: Brain: No evidence of acute infarction, hemorrhage, hydrocephalus,
extra-axial collection or mass lesion/mass effect.

Vascular: No hyperdense vessel or unexpected calcification.

Skull: Normal. Negative for fracture or focal lesion.

Sinuses/Orbits: No acute finding.

Other: None.
IMPRESSION: Normal head CT, stable from the prior exam.

## 2020-07-17 ENCOUNTER — Encounter (HOSPITAL_COMMUNITY): Payer: Self-pay | Admitting: Emergency Medicine

## 2020-07-17 ENCOUNTER — Other Ambulatory Visit: Payer: Self-pay

## 2020-07-17 ENCOUNTER — Emergency Department (HOSPITAL_COMMUNITY)
Admission: EM | Admit: 2020-07-17 | Discharge: 2020-07-17 | Disposition: A | Discharge status: Home / Self Care | Payer: Managed Care, Other (non HMO) | Attending: Emergency Medicine | Admitting: Emergency Medicine

## 2020-07-17 DIAGNOSIS — R059 Cough, unspecified: Secondary | ICD-10-CM | POA: Diagnosis present

## 2020-07-17 DIAGNOSIS — U071 COVID-19: Secondary | ICD-10-CM | POA: Insufficient documentation

## 2020-07-17 LAB — RESP PANEL BY RT-PCR (FLU A&B, COVID) ARPGX2
Influenza A by PCR: NEGATIVE
Influenza B by PCR: NEGATIVE
SARS Coronavirus 2 by RT PCR: POSITIVE — AB

## 2020-07-17 MED ORDER — IBUPROFEN 400 MG PO TABS
400.0000 mg | ORAL_TABLET | Freq: Once | ORAL | Status: AC | PRN
  Administered 2020-07-17: 400 mg via ORAL
  Filled 2020-07-17: qty 1

## 2020-07-17 NOTE — ED Provider Notes (Signed)
[place] EMERGENCY DEPARTMENT Provider Note   CSN: [id number] Arrival date & time: 07/17/20  1318     History No chief complaint on file.   Nicole Benton is a 25 y.o. female.  Patient c/o fever, body aches, not productive cough, and intermittent headache for the past 1-2 days. Symptoms acute onset, moderate, persistent. Denies chest pain or sob. No trouble swallowing. No abd pain or nvd. No dysuria or gu c/o. No rash/skin lesions. Headache intermittent, frontal, mild. No acute, abrupt or severe headaches. No neck pain or stiffness. No sinus drainage or pain. No specific known ill contacts, or known covid/flu exposure. Has been immunized for covid.   The history is provided by the patient.       Past Medical History:  Diagnosis Date  . Syncope and collapse     Patient Active Problem List   Diagnosis Date Noted  . Altered level of consciousness 03/12/2017    History reviewed. No pertinent surgical history.   OB History   No obstetric history on file.     Family History  Problem Relation Age of Onset  . Hypertension Mother   . Heart disease Father   . Hypertension Father     Social History   Tobacco Use  . Smoking status: Never Smoker  . Smokeless tobacco: Never Used  Vaping Use  . Vaping Use: Never used  Substance Use Topics  . Alcohol use: No  . Drug use: No    Home Medications Prior to Admission medications   Medication Sig Start Date End Date Taking? Authorizing Provider  methylPREDNISolone (MEDROL) 4 MG tablet Take 1 tablet (4 mg total) by mouth 2 (two) times daily. 12/14/18   Robyn Haber, MD  triamcinolone (KENALOG) 0.025 % ointment Apply 1 application topically 2 (two) times daily. 01/06/20   Vanessa Kick, MD    Allergies    Patient has no known allergies.  Review of Systems   Review of Systems  Constitutional: Positive for fever.  HENT: Negative for sinus pain and sore throat.   Eyes: Negative for pain and  redness.  Respiratory: Positive for cough. Negative for shortness of breath.   Cardiovascular: Negative for chest pain.  Gastrointestinal: Negative for abdominal pain, diarrhea and vomiting.  Genitourinary: Negative for dysuria and flank pain.  Musculoskeletal: Positive for myalgias. Negative for back pain, neck pain and neck stiffness.  Skin: Negative for rash.  Neurological: Negative for weakness and numbness.  Hematological: Does not bruise/bleed easily.  Psychiatric/Behavioral: Negative for confusion.    Physical Exam Updated Vital Signs BP 139/85   Pulse 100   Temp 98.2 F (36.8 C) (Oral)   Resp 16   Ht 1.6 m (5\' 3" )   Wt 86.2 kg   SpO2 100%   BMI 33.66 kg/m   Physical Exam Vitals and nursing note reviewed.  Constitutional:      Appearance: Normal appearance. She is well-developed.  HENT:     Head: Atraumatic.     Comments: No sinus or temporal tenderness.     Nose: Nose normal.     Mouth/Throat:     Mouth: Mucous membranes are moist.     Pharynx: Oropharynx is clear. No oropharyngeal exudate or posterior oropharyngeal erythema.  Eyes:     General: No scleral icterus.    Conjunctiva/sclera: Conjunctivae normal.     Pupils: Pupils are equal, round, and reactive to light.  Neck:     Trachea: No tracheal deviation.     Comments:  No stiffness or rigidity.  Cardiovascular:     Rate and Rhythm: Normal rate and regular rhythm.     Pulses: Normal pulses.     Heart sounds: Normal heart sounds. No murmur heard. No friction rub. No gallop.   Pulmonary:     Effort: Pulmonary effort is normal. No respiratory distress.     Breath sounds: Normal breath sounds.  Abdominal:     General: Bowel sounds are normal. There is no distension.     Palpations: Abdomen is soft.     Tenderness: There is no abdominal tenderness.  Genitourinary:    Comments: No cva tenderness.  Musculoskeletal:        General: No swelling or tenderness.     Cervical back: Normal range of motion and  neck supple. No rigidity. No muscular tenderness.     Right lower leg: No edema.     Left lower leg: No edema.  Skin:    General: Skin is warm and dry.     Findings: No rash.  Neurological:     Mental Status: She is alert.     Comments: Alert, speech normal. Steady gait.   Psychiatric:        Mood and Affect: Mood normal.     ED Results / Procedures / Treatments   Labs (all labs ordered are listed, but only abnormal results are displayed) Results for orders placed or performed during the hospital encounter of 07/17/20  Resp Panel by RT-PCR (Flu A&B, Covid) Nasopharyngeal Swab   Specimen: Nasopharyngeal Swab; Nasopharyngeal(NP) swabs in vial transport medium  Result Value Ref Range   SARS Coronavirus 2 by RT PCR POSITIVE (A) NEGATIVE   Influenza A by PCR NEGATIVE NEGATIVE   Influenza B by PCR NEGATIVE NEGATIVE    EKG None  Radiology No results found.  Procedures Procedures (including critical care time)  Medications Ordered in ED Medications  ibuprofen (ADVIL) tablet 400 mg (400 mg Oral Given 07/17/20 1429)    ED Course  I have reviewed the triage vital signs and the nursing notes.  Pertinent labs & imaging results that were available during my care of the patient were reviewed by me and considered in my medical decision making (see chart for details).    MDM Rules/Calculators/A&P                         Labs sent.   Reviewed nursing notes and prior charts for additional history.   Labs reviewed/interpreted by me - covid test is positive.   Nicole Benton was evaluated in Emergency Department on 07/17/2020 for the symptoms described in the history of present illness. She was evaluated in the context of the global COVID-19 pandemic, which necessitated consideration that the patient might be at risk for infection with the SARS-CoV-2 virus that causes COVID-19. Institutional protocols and algorithms that pertain to the evaluation of patients at risk for COVID-19  are in a state of rapid change based on information released by regulatory bodies including the CDC and federal and state organizations. These policies and algorithms were followed during the patient's care in the ED.  Pt given ibuprofen in triage - notes improvement in symptoms.   Pt is comfortable appearing, well hydrated, in no acute distress, breathing comfortably.  Pt currently appears stable for d/c.    Final Clinical Impression(s) / ED Diagnoses Final diagnoses:  None    Rx / DC Orders ED Discharge Orders    None  Cathren Laine, MD 07/17/20 [phone]

## 2020-07-17 NOTE — ED Triage Notes (Signed)
Patient complains of headache and body aches that started this morning. Denies sick contacts. Received second dose of COVID vaccine in July, did not receive booster.

## 2020-07-17 NOTE — Discharge Instructions (Signed)
It was our pleasure to provide your ER care today - we hope that you feel better.  Your covid test is positive - see covid information/instructions.   Stay well hydrated, and get adequate nutrition. Stay active, take full and deep breaths.   You may take acetaminophen and/or ibuprofen as need for fever/aches.   Return to ER if worse, new symptoms, increased trouble breathing, or other concern.

## 2020-11-19 ENCOUNTER — Ambulatory Visit (HOSPITAL_COMMUNITY)
Admission: EM | Admit: 2020-11-19 | Discharge: 2020-11-19 | Disposition: A | Discharge status: Home / Self Care | Payer: Managed Care, Other (non HMO) | Attending: Emergency Medicine | Admitting: Emergency Medicine

## 2020-11-19 ENCOUNTER — Encounter (HOSPITAL_COMMUNITY): Payer: Self-pay | Admitting: *Deleted

## 2020-11-19 ENCOUNTER — Other Ambulatory Visit: Payer: Self-pay

## 2020-11-19 DIAGNOSIS — L209 Atopic dermatitis, unspecified: Secondary | ICD-10-CM

## 2020-11-19 MED ORDER — TRIAMCINOLONE ACETONIDE 0.025 % EX OINT: 1.0000 "application " | TOPICAL_OINTMENT | Freq: Two times a day (BID) | CUTANEOUS | 0 refills | Status: DC

## 2020-11-19 NOTE — Discharge Instructions (Signed)
Use the triamcinolone cream sparingly and only for a limited time.    Follow-up with a dermatologist as soon as possible.    Establish a primary care provider soon as possible.  Assistance from [facility] health has been requested.

## 2020-11-19 NOTE — ED Triage Notes (Signed)
Pt reports rash to face and back.

## 2020-11-19 NOTE — ED Provider Notes (Signed)
[facility]    CSN: [id number] Arrival date & time: 11/19/20  1115      History   Chief Complaint Chief Complaint  Patient presents with  . Rash    HPI Nicole Benton is a 25 y.o. female.   Patient presents with rash on her face and upper back for several weeks.  She states this is a recurrent rash and she has been seen here for this before.  She states she has had improvement with triamcinolone cream in the past.  She does not have a PCP and has not seen a dermatologist.  She denies other symptoms, including fever, chills, sore throat, cough, shortness of breath.  Her medical history includes syncope, COVID in January 2022.  The history is provided by the patient and medical records.    Past Medical History:  Diagnosis Date  . Syncope and collapse     Patient Active Problem List   Diagnosis Date Noted  . Altered level of consciousness 03/12/2017    History reviewed. No pertinent surgical history.  OB History   No obstetric history on file.      Home Medications    Prior to Admission medications   Medication Sig Start Date End Date Taking? Authorizing Provider  triamcinolone (KENALOG) 0.025 % ointment Apply 1 application topically 2 (two) times daily. 11/19/20  Yes Mickie Bail, NP  methylPREDNISolone (MEDROL) 4 MG tablet Take 1 tablet (4 mg total) by mouth 2 (two) times daily. 12/14/18   Elvina Sidle, MD    Family History Family History  Problem Relation Age of Onset  . Hypertension Mother   . Heart disease Father   . Hypertension Father     Social History Social History   Tobacco Use  . Smoking status: Never Smoker  . Smokeless tobacco: Never Used  Vaping Use  . Vaping Use: Never used  Substance Use Topics  . Alcohol use: No  . Drug use: No     Allergies   Patient has no known allergies.   Review of Systems Review of Systems  Constitutional: Negative for chills and fever.  HENT: Negative for ear pain and sore throat.    Respiratory: Negative for cough and shortness of breath.   Cardiovascular: Negative for chest pain and palpitations.  Gastrointestinal: Negative for abdominal pain and vomiting.  Skin: Positive for rash. Negative for color change.  All other systems reviewed and are negative.    Physical Exam Triage Vital Signs ED Triage Vitals  Enc Vitals Group     BP      Pulse      Resp      Temp      Temp src      SpO2      Weight      Height      Head Circumference      Peak Flow      Pain Score      Pain Loc      Pain Edu?      Excl. in GC?    No data found.  Updated Vital Signs BP 134/86 (BP Location: Right Arm)   Pulse 73   Temp 97.7 F (36.5 C) (Oral)   Resp 16   LMP 10/28/2020   SpO2 100%   Visual Acuity Right Eye Distance:   Left Eye Distance:   Bilateral Distance:    Right Eye Near:   Left Eye Near:    Bilateral Near:  Physical Exam Vitals and nursing note reviewed.  Constitutional:      General: She is not in acute distress.    Appearance: She is well-developed. She is not ill-appearing.  HENT:     Head: Normocephalic and atraumatic.  Cardiovascular:     Rate and Rhythm: Normal rate and regular rhythm.     Heart sounds: Normal heart sounds.  Pulmonary:     Effort: Pulmonary effort is normal. No respiratory distress.     Breath sounds: Normal breath sounds.  Abdominal:     Palpations: Abdomen is soft.     Tenderness: There is no abdominal tenderness.  Musculoskeletal:     Cervical back: Neck supple.  Skin:    General: Skin is warm and dry.     Findings: Rash present.     Comments: Dry scaly rash on face, neck, upper back.  No drainage or erythema.  Neurological:     General: No focal deficit present.     Mental Status: She is alert and oriented to person, place, and time.     Gait: Gait normal.  Psychiatric:        Mood and Affect: Mood normal.        Behavior: Behavior normal.      UC Treatments / Results  Labs (all labs ordered are  listed, but only abnormal results are displayed) Labs Reviewed - No data to display  EKG   Radiology No results found.  Procedures Procedures (including critical care time)  Medications Ordered in UC Medications - No data to display  Initial Impression / Assessment and Plan / UC Course  I have reviewed the triage vital signs and the nursing notes.  Pertinent labs & imaging results that were available during my care of the patient were reviewed by me and considered in my medical decision making (see chart for details).   Atopic dermatitis.  Treating with low-dose triamcinolone cream.  Discussed potential side effects with patient, including skin lightening and scarring.  Instructed her to use the triamcinolone cream sparingly and only for a few days.  Discussed that she needs to follow-up with a dermatologist as soon as possible for this recurrent rash.  Instructed her to establish a PCP as soon as possible and [place] assistance with this requested.  She agrees to plan of care.   Final Clinical Impressions(s) / UC Diagnoses   Final diagnoses:  Atopic dermatitis, unspecified type     Discharge Instructions     Use the triamcinolone cream sparingly and only for a limited time.    Follow-up with a dermatologist as soon as possible.    Establish a primary care provider soon as possible.  Assistance from [facility] health has been requested.        ED Prescriptions    Medication Sig Dispense Auth. Provider   triamcinolone (KENALOG) 0.025 % ointment Apply 1 application topically 2 (two) times daily. 30 g Mickie Bail, NP     PDMP not reviewed this encounter.   Mickie Bail, NP 11/19/20 1300

## 2020-12-01 ENCOUNTER — Other Ambulatory Visit (HOSPITAL_BASED_OUTPATIENT_CLINIC_OR_DEPARTMENT_OTHER)
Admission: RE | Admit: 2020-12-01 | Discharge: 2020-12-01 | Disposition: A | Discharge status: Home / Self Care | Payer: Managed Care, Other (non HMO) | Source: Ambulatory Visit | Attending: Family Medicine | Admitting: Family Medicine

## 2020-12-01 ENCOUNTER — Ambulatory Visit (INDEPENDENT_AMBULATORY_CARE_PROVIDER_SITE_OTHER): Payer: Managed Care, Other (non HMO) | Admitting: Family Medicine

## 2020-12-01 ENCOUNTER — Other Ambulatory Visit: Payer: Self-pay

## 2020-12-01 ENCOUNTER — Ambulatory Visit (HOSPITAL_BASED_OUTPATIENT_CLINIC_OR_DEPARTMENT_OTHER): Payer: Managed Care, Other (non HMO) | Admitting: Family Medicine

## 2020-12-01 ENCOUNTER — Encounter (HOSPITAL_BASED_OUTPATIENT_CLINIC_OR_DEPARTMENT_OTHER): Payer: Self-pay | Admitting: Family Medicine

## 2020-12-01 DIAGNOSIS — R1030 Lower abdominal pain, unspecified: Secondary | ICD-10-CM | POA: Insufficient documentation

## 2020-12-01 DIAGNOSIS — L209 Atopic dermatitis, unspecified: Secondary | ICD-10-CM

## 2020-12-01 DIAGNOSIS — G8929 Other chronic pain: Secondary | ICD-10-CM | POA: Diagnosis not present

## 2020-12-01 DIAGNOSIS — R519 Headache, unspecified: Secondary | ICD-10-CM

## 2020-12-01 DIAGNOSIS — R109 Unspecified abdominal pain: Secondary | ICD-10-CM | POA: Insufficient documentation

## 2020-12-01 LAB — COMPREHENSIVE METABOLIC PANEL
ALT: 8 U/L (ref 0–44)
AST: 15 U/L (ref 15–41)
Albumin: 4.4 g/dL (ref 3.5–5.0)
Alkaline Phosphatase: 45 U/L (ref 38–126)
Anion gap: 7 (ref 5–15)
BUN: 10 mg/dL (ref 6–20)
CO2: 27 mmol/L (ref 22–32)
Calcium: 9.2 mg/dL (ref 8.9–10.3)
Chloride: 103 mmol/L (ref 98–111)
Creatinine, Ser: 0.78 mg/dL (ref 0.44–1.00)
GFR, Estimated: >60 mL/min (ref >60)
Glucose, Bld: 92 mg/dL (ref 70–99)
Potassium: 3.4 mmol/L — ABNORMAL LOW (ref 3.5–5.1)
Sodium: 137 mmol/L (ref 135–145)
Total Bilirubin: 0.3 mg/dL (ref 0.3–1.2)
Total Protein: 7.2 g/dL (ref 6.5–8.1)

## 2020-12-01 LAB — CBC WITH DIFFERENTIAL/PLATELET
Abs Immature Granulocytes: 0.01 10*3/uL (ref 0.00–0.07)
Basophils Absolute: 0 10*3/uL (ref 0.0–0.1)
Basophils Relative: 1 %
Eosinophils Absolute: 0.1 10*3/uL (ref 0.0–0.5)
Eosinophils Relative: 3 %
HCT: 33.2 % — ABNORMAL LOW (ref 36.0–46.0)
Hemoglobin: 10 g/dL — ABNORMAL LOW (ref 12.0–15.0)
Immature Granulocytes: 0 %
Lymphocytes Relative: 40 %
Lymphs Abs: 1.6 10*3/uL (ref 0.7–4.0)
MCH: 22.9 pg — ABNORMAL LOW (ref 26.0–34.0)
MCHC: 30.1 g/dL (ref 30.0–36.0)
MCV: 76 fL — ABNORMAL LOW (ref 80.0–100.0)
Monocytes Absolute: 0.3 10*3/uL (ref 0.1–1.0)
Monocytes Relative: 8 %
Neutro Abs: 1.9 10*3/uL (ref 1.7–7.7)
Neutrophils Relative %: 48 %
Platelets: 258 10*3/uL (ref 150–400)
RBC: 4.37 MIL/uL (ref 3.87–5.11)
RDW: 15.6 % — ABNORMAL HIGH (ref 11.5–15.5)
WBC: 4 10*3/uL (ref 4.0–10.5)
nRBC: 0 % (ref 0.0–0.2)

## 2020-12-01 LAB — HEMOGLOBIN A1C
Hgb A1c MFr Bld: 5.8 % — ABNORMAL HIGH (ref 4.8–5.6)
Mean Plasma Glucose: 119.76 mg/dL

## 2020-12-01 LAB — LIPID PANEL
Cholesterol: 233 mg/dL — ABNORMAL HIGH (ref 0–200)
HDL: 64 mg/dL (ref >40)
LDL Cholesterol: 156 mg/dL — ABNORMAL HIGH (ref 0–99)
Total CHOL/HDL Ratio: 3.6 RATIO
Triglycerides: 66 mg/dL (ref <150)
VLDL: 13 mg/dL (ref 0–40)

## 2020-12-01 NOTE — Patient Instructions (Signed)
  Medication Instructions:  Your physician recommends that you continue on your current medications as directed. Please refer to the Current Medication list given to you today. --If you need a refill on any your medications before your next appointment, please call your pharmacy first. If no refills are authorized on file call the office.--  Lab Work: Your physician has recommended that you have lab work today: CBC, Comprehensive Metabolic Panel, Lipid Profile, and HgB A1C If you have labs (blood work) drawn today and your tests are completely normal, you will receive your results only by: Marland Kitchen MyChart Message (if you have MyChart) OR . A phone call from our staff. Please ensure you check your voicemail in the event that you authorized detailed messages to be left on a delegated number. If you have any lab test that is abnormal or we need to change your treatment, we will call you to review the results.  Referrals/Procedures/Imaging: A referral has been placed for you to a Dermatologist for evaluation and treatment. Someone from the scheduling department will be in contact with you in regards to coordinating your consultation. If you do not hear from any of the schedulers within 7-10 business days please give our office a call.  Follow-Up: Your next appointment:   Your physician recommends that you schedule a follow-up appointment in: 3-4 WEEKS with Dr. de Peru  Thanks for letting us be apart of your health journey!!  Primary Care and Sports Medicine   Dr. de Peru and Shawna Clamp, DNP, AGNP  We recommend signing up for the patient portal called "MyChart".  Sign up information is provided on this After Visit Summary.  MyChart is used to connect with patients for Virtual Visits (Telemedicine).  Patients are able to view lab/test results, encounter notes, upcoming appointments, etc.  Non-urgent messages can be sent to your provider as well.   To learn more about what you can do with MyChart,  please visit --  [email or web address].

## 2020-12-02 ENCOUNTER — Telehealth (HOSPITAL_BASED_OUTPATIENT_CLINIC_OR_DEPARTMENT_OTHER): Payer: Self-pay

## 2020-12-02 ENCOUNTER — Other Ambulatory Visit (HOSPITAL_BASED_OUTPATIENT_CLINIC_OR_DEPARTMENT_OTHER)
Admission: RE | Admit: 2020-12-02 | Discharge: 2020-12-02 | Disposition: A | Discharge status: Home / Self Care | Payer: Managed Care, Other (non HMO) | Source: Ambulatory Visit | Attending: Family Medicine | Admitting: Family Medicine

## 2020-12-02 DIAGNOSIS — D649 Anemia, unspecified: Secondary | ICD-10-CM | POA: Insufficient documentation

## 2020-12-02 LAB — IRON AND TIBC
Iron: 26 ug/dL — ABNORMAL LOW (ref 28–170)
Saturation Ratios: 6 % — ABNORMAL LOW (ref 10.4–31.8)
TIBC: 469 ug/dL — ABNORMAL HIGH (ref 250–450)
UIBC: 443 ug/dL

## 2020-12-02 LAB — FERRITIN: Ferritin: 23 ng/mL (ref 11–307)

## 2020-12-02 NOTE — Telephone Encounter (Signed)
-----   Message from Hosie Poisson Peru, MD sent at 12/02/2020  7:27 AM EDT ----- Normal white blood cell and red blood cell count.  Hemoglobin continues to be low indicating anemia.  Plan to check iron studies to evaluate for underlying cause.  Slightly low potassium, otherwise electrolytes normal.  Kidney and liver function both normal.  Slightly elevated total cholesterol and "bad" cholesterol.  Hemoglobin A1c is 5.8%, this is within "prediabetes" range.  This indicates increased risk of developing diabetes in the future.  In regards to the cholesterol and "prediabetes", initial recommendation would be lifestyle modifications including dietary changes to focus on increasing fresh fruits and vegetables, lean protein in the diet as well as gradually increasing level of weekly physical activity.

## 2020-12-02 NOTE — Telephone Encounter (Signed)
Results released by Dr. de Peru and reviewed by patient via MyChart Instructed patient to contact the office with any questions or concerns. Confirmed that Iron Studies can be added on to lab panel.  Orders Placed

## 2020-12-05 ENCOUNTER — Encounter (HOSPITAL_BASED_OUTPATIENT_CLINIC_OR_DEPARTMENT_OTHER): Payer: Self-pay | Admitting: Family Medicine

## 2020-12-05 NOTE — Assessment & Plan Note (Signed)
Patient provided with headache diary worksheet, instructed to complete prior to next appointment Can continue with NSAIDs as needed to help with pain control Check labs as below

## 2020-12-05 NOTE — Assessment & Plan Note (Signed)
Uncertain etiology, possibly related to underlying constipation Advised on assessing typical daily dietary fiber intake.  Also recommend assessing daily water intake, should be having at least 8 glasses of water daily. Check labs as below

## 2020-12-05 NOTE — Assessment & Plan Note (Signed)
Given chronic intermittent nature with lack of current response to low-dose steroid cream, will refer to dermatology for further evaluation and treatment

## 2020-12-05 NOTE — Progress Notes (Signed)
New Patient Office Visit  Subjective:  Patient ID: Nicole Benton, female    DOB: [birth year]  Age: 25 y.o. MRN: [id number]  CC:  Chief Complaint  Patient presents with  . Establish Care  . Rash    Patient has complaints of itching and rash on her face. Patient was seen in the ED for similar issue on 05/07  . Headache    Patient complains of intermittent headaches x 1 year. Patient states they are starting to get worse/  . Back Pain    Patient complaining of back pain x 2 years. Non descriptive of pain  . Abdominal Pain    Patient complaining of abdominal pain for the last 2-3 months associated with constipations. Patient states she has a had a bowel movement today.   . Insomnia    Patient complains of difficulty sleeping for the last 2-3 months    HPI Nicole Benton is a 25 year old female presenting to establish in clinic.  She reports present concerns today related to rash, headache, abdominal pain, back pain and insomnia.  She denies any chronic medical problems.  Rash: Reports that she has had a rash on her face with associated itching.  She has been seen previously in the emergency room and at urgent cares for this.  Most recently she was seen on May 7.  At that time she was treated with low-dose steroid cream and instructed to follow-up with a dermatologist.  On chart review, patient has had similar treatment courses with use of topical steroids to treat symptoms.  Headaches: Reports that these have been ongoing for greater than 1 year.  Indicates increased frequency and headaches beginning about 1 year ago.  Headaches tend to be left-sided with associated eye pain, however she denies any change in vision or blurry vision, no significant photophobia.  She will have associated phonophobia.  Occasionally she will use ibuprofen for her headaches with some relief.  She typically only takes ibuprofen if she is having a headache when trying to go to sleep.  Denies any  associated numbness or tingling.  Reports that she will have a headache about every 2 days.  Abdominal pain: Initially started about 2 years ago.  Pain initially began in right lower quadrant.  At present, she does not have any pain.  Feels the pain comes and goes about every 2 weeks.  When it occurs, it lasts for a few days.  Reports some constipation, indicating about 3-4 bowel movements per week.  Denies any pain with bowel movements, no blood in the stool.  No medications tried in relation to her constipation.  Not sure of her typical fiber intake.  Feels that her abdominal pain is worsened with prolonged standing.  She is also not sure of her typical daily water intake.  Past Medical History:  Diagnosis Date  . Syncope and collapse     History reviewed. No pertinent surgical history.  Family History  Problem Relation Age of Onset  . Hypertension Mother   . Heart disease Father   . Hypertension Father   . Diabetes Maternal Grandmother   . Diabetes Paternal Grandmother     Social History   Socioeconomic History  . Marital status: Single    Spouse name: Not on file  . Number of children: Not on file  . Years of education: Not on file  . Highest education level: Not on file  Occupational History  . Not on file  Tobacco Use  . Smoking  status: Never Smoker  . Smokeless tobacco: Never Used  Vaping Use  . Vaping Use: Never used  Substance and Sexual Activity  . Alcohol use: No  . Drug use: No  . Sexual activity: Yes    Birth control/protection: None  Other Topics Concern  . Not on file  Social History Narrative  . Not on file   Social Determinants of Health   Financial Resource Strain: Not on file  Food Insecurity: Not on file  Transportation Needs: Not on file  Physical Activity: Not on file  Stress: Not on file  Social Connections: Not on file  Intimate Partner Violence: Not on file    Objective:   Today's Vitals: BP 120/64   Pulse 81   Ht 5\' 6"  (1.676 m)    Wt 181 lb 6.4 oz (82.3 kg)   SpO2 99%   BMI 29.28 kg/m   Physical Exam  25 year old female in no acute distress Cardiovascular exam with regular rate and rhythm, no murmurs appreciated Lungs clear to auscultation bilaterally Abdomen soft, nontender, nondistended.  Normal bowel sounds.  No organomegaly.  Assessment & Plan:   Problem List Items Addressed This Visit      Musculoskeletal and Integument   Atopic dermatitis    Given chronic intermittent nature with lack of current response to low-dose steroid cream, will refer to dermatology for further evaluation and treatment      Relevant Orders   Ambulatory referral to Dermatology     Other   Headache    Patient provided with headache diary worksheet, instructed to complete prior to next appointment Can continue with NSAIDs as needed to help with pain control Check labs as below      Relevant Orders   CBC with Differential/Platelet (Completed)   Lipid panel (Completed)   Comprehensive metabolic panel (Completed)   Hemoglobin A1c (Completed)   Abdominal pain    Uncertain etiology, possibly related to underlying constipation Advised on assessing typical daily dietary fiber intake.  Also recommend assessing daily water intake, should be having at least 8 glasses of water daily. Check labs as below      Relevant Orders   CBC with Differential/Platelet (Completed)   Lipid panel (Completed)   Comprehensive metabolic panel (Completed)   Hemoglobin A1c (Completed)      Outpatient Encounter Medications as of 12/01/2020  Medication Sig  . methylPREDNISolone (MEDROL) 4 MG tablet Take 1 tablet (4 mg total) by mouth 2 (two) times daily.  12/03/2020 triamcinolone (KENALOG) 0.025 % ointment Apply 1 application topically 2 (two) times daily.   No facility-administered encounter medications on file as of 12/01/2020.    Follow-up: Return in about 4 weeks (around 12/29/2020) for Follow Up.  Follow-up on headache diary, abdominal  pain/constipation, insomnia and back pain.  Bitha Fauteux J De 12/31/2020, MD

## 2020-12-22 ENCOUNTER — Ambulatory Visit (HOSPITAL_BASED_OUTPATIENT_CLINIC_OR_DEPARTMENT_OTHER): Payer: Managed Care, Other (non HMO) | Admitting: Family Medicine

## 2020-12-27 ENCOUNTER — Other Ambulatory Visit: Payer: Self-pay

## 2020-12-27 ENCOUNTER — Encounter (HOSPITAL_BASED_OUTPATIENT_CLINIC_OR_DEPARTMENT_OTHER): Payer: Self-pay | Admitting: Family Medicine

## 2020-12-27 ENCOUNTER — Ambulatory Visit (INDEPENDENT_AMBULATORY_CARE_PROVIDER_SITE_OTHER): Payer: Managed Care, Other (non HMO) | Admitting: Family Medicine

## 2020-12-27 VITALS — BP 110/60 | HR 80 | Ht 66.0 in | Wt 181.8 lb

## 2020-12-27 DIAGNOSIS — G4700 Insomnia, unspecified: Secondary | ICD-10-CM | POA: Diagnosis not present

## 2020-12-27 DIAGNOSIS — G8929 Other chronic pain: Secondary | ICD-10-CM | POA: Diagnosis not present

## 2020-12-27 DIAGNOSIS — R519 Headache, unspecified: Secondary | ICD-10-CM | POA: Diagnosis not present

## 2020-12-27 DIAGNOSIS — L209 Atopic dermatitis, unspecified: Secondary | ICD-10-CM | POA: Diagnosis not present

## 2020-12-27 NOTE — Assessment & Plan Note (Signed)
Reports that she has had ongoing issues for about 2 years with trouble sleeping.  Primary issue is trouble with sleep initiation.  Indicates they can take her multiple hours to fall asleep.  Generally, she is sleeping about 5 to 6 hours per night.  Does endorse some phone use near bedtime.  Has not tried any specific medications or behavioral changes. Discussed treatment options including pharmacotherapy, counseling Recommend initial trial of OTC melatonin, take about 30 minutes before bedtime Recommend evaluation with Dr. Bosie Clos for CBT, patient interested, referral placed Recommend changes to daily routine, bedtime routine, handout with general sleep hygiene guidelines provided to patient

## 2020-12-27 NOTE — Assessment & Plan Note (Signed)
Refer to dermatology previously, unfortunately soonest appointment I had was 5 to 6 months from now Will place new referral to [facility] dermatology in order for sooner evaluation

## 2020-12-27 NOTE — Assessment & Plan Note (Signed)
Endorses improvement in symptoms since last visit.  Indicates that headaches have been less frequent as well as less severe.  Reports that she feels it is primarily due to being more hydrated throughout the day as she has been drinking more water recently.  Will use ibuprofen as needed to help with headaches. Encouraged to continue with adequate daily hydration Can continue to use NSAID as needed to help with pain control Would continue with headache diary to assess for any possible triggers

## 2020-12-27 NOTE — Patient Instructions (Addendum)
  Medication Instructions:  Your physician has recommended you make the following change in your medication:  -- START Melatonin 5-10 mg - Take 1 tablet 30 min before bed. Can purchase over the counter --If you need a refill on any your medications before your next appointment, please call your pharmacy first. If no refills are authorized on file call the office.-- Referrals/Procedures/Imaging: A referral has been placed for you to Dr. Bosie Clos with [facility] Medicine. Dr. Bosie Clos is a psychologist who specializes in cognitive and behavioral therapy, he does not prescribe medications. Someone from the scheduling department will be in contact with you in regards to coordinating your consultation. If you do not hear from any of the schedulers within 7-10 business days please give our office a call  A referral has been placed for you to [facility] for evaluation and treatment. Someone from the scheduling department will be in contact with you in regards to coordinating your consultation. If you do not hear from any of the schedulers within 7-10 business days please give our office a call.   [facility] [address] [place] [address] [phone].  Follow-Up: Your next appointment:   Your physician recommends that you schedule a follow-up appointment in: 3 MONTHS with Dr. de Peru  Thanks for letting us be apart of your health journey!!  Primary Care and Sports Medicine   Dr. Ceasar Mons Peru   We encourage you to activate your patient portal called "MyChart".  Sign up information is provided on this After Visit Summary.  MyChart is used to connect with patients for Virtual Visits (Telemedicine).  Patients are able to view lab/test results, encounter notes, upcoming appointments, etc.  Non-urgent messages can be sent to your provider as well. To learn more about what you can do with MyChart, please visit --  [email or web address].

## 2020-12-27 NOTE — Progress Notes (Signed)
    Procedures performed today:    None.  Independent interpretation of notes and tests performed by another provider:   None.  Brief History, Exam, Impression, and Recommendations:    BP 110/60   Pulse 80   Ht 5\' 6"  (1.676 m)   Wt 181 lb 12.8 oz (82.5 kg)   SpO2 96%   BMI 29.34 kg/m   Atopic dermatitis Refer to dermatology previously, unfortunately soonest appointment I had was 5 to 6 months from now Will place new referral to [facility] dermatology in order for sooner evaluation  Insomnia Reports that she has had ongoing issues for about 2 years with trouble sleeping.  Primary issue is trouble with sleep initiation.  Indicates they can take her multiple hours to fall asleep.  Generally, she is sleeping about 5 to 6 hours per night.  Does endorse some phone use near bedtime.  Has not tried any specific medications or behavioral changes. Discussed treatment options including pharmacotherapy, counseling Recommend initial trial of OTC melatonin, take about 30 minutes before bedtime Recommend evaluation with [facility] for CBT, patient interested, referral placed Recommend changes to daily routine, bedtime routine, handout with general sleep hygiene guidelines provided to patient  Headache Endorses improvement in symptoms since last visit.  Indicates that headaches have been less frequent as well as less severe.  Reports that she feels it is primarily due to being more hydrated throughout the day as she has been drinking more water recently.  Will use ibuprofen as needed to help with headaches. Encouraged to continue with adequate daily hydration Can continue to use NSAID as needed to help with pain control Would continue with headache diary to assess for any possible triggers  Plan for follow-up in about 3 months or sooner as needed.  Follow-up on headaches, insomnia.  Repeat PHQ-9 at next visit.  ___________________________________________ Errica Dutil de Bosie Clos, MD, ABFM,  [facility] Primary Care and Sports Medicine [facility]

## 2021-03-29 ENCOUNTER — Encounter (HOSPITAL_BASED_OUTPATIENT_CLINIC_OR_DEPARTMENT_OTHER): Payer: Self-pay | Admitting: Family Medicine

## 2021-03-29 ENCOUNTER — Ambulatory Visit (INDEPENDENT_AMBULATORY_CARE_PROVIDER_SITE_OTHER): Payer: Managed Care, Other (non HMO) | Admitting: Family Medicine

## 2021-03-29 ENCOUNTER — Other Ambulatory Visit: Payer: Self-pay

## 2021-03-29 VITALS — BP 100/64 | HR 73 | Ht 66.0 in | Wt 184.0 lb

## 2021-03-29 DIAGNOSIS — G4700 Insomnia, unspecified: Secondary | ICD-10-CM

## 2021-03-29 DIAGNOSIS — Z7689 Persons encountering health services in other specified circumstances: Secondary | ICD-10-CM

## 2021-03-29 DIAGNOSIS — L209 Atopic dermatitis, unspecified: Secondary | ICD-10-CM | POA: Diagnosis not present

## 2021-03-29 MED ORDER — TRIAMCINOLONE ACETONIDE 0.025 % EX OINT: 1.0000 "application " | TOPICAL_OINTMENT | Freq: Two times a day (BID) | CUTANEOUS | 2 refills | Status: DC

## 2021-03-29 NOTE — Patient Instructions (Signed)
  Medication Instructions:  Your physician recommends that you continue on your current medications as directed. Please refer to the Current Medication list given to you today. --If you need a refill on any your medications before your next appointment, please call your pharmacy first. If no refills are authorized on file call the office.--  Referrals/Procedures/Imaging: A referral has been placed for you to [facility] Dermatology for evaluation and treatment. Someone from the scheduling department will be in contact with you in regards to coordinating your consultation. If you do not hear from any of the schedulers within 7-10 business days please give our office a call. [address], [place]. (P) [phone]  A referral has been placed for you to [facility] for evaluation and treatment. Someone from the scheduling department will be in contact with you in regards to coordinating your consultation. If you do not hear from any of the schedulers within 7-10 business days please give our office a call.   Follow-Up: Your next appointment:   Your physician recommends that you schedule a follow-up appointment in: 3-4 MONTHS with Dr. de Peru  Thanks for letting us be apart of your health journey!!  Primary Care and Sports Medicine   Dr. Ceasar Mons Peru   We encourage you to activate your patient portal called "MyChart".  Sign up information is provided on this After Visit Summary.  MyChart is used to connect with patients for Virtual Visits (Telemedicine).  Patients are able to view lab/test results, encounter notes, upcoming appointments, etc.  Non-urgent messages can be sent to your provider as well. To learn more about what you can do with MyChart, please visit --  [email or web address].

## 2021-03-29 NOTE — Assessment & Plan Note (Signed)
Has made some changes regarding sleep hygiene measures as discussed at last appointment She feels that insomnia has improved At present, she feels that she falls asleep within 20 to 30 minutes.  This is improved as she was reporting that it could take hours for her to fall asleep.  She is also reporting about 8 hours of sleep on average per night which is much improved from before Has not met with Dr. Michail Sermon but still plans to do some, is scheduled for visit with him upcoming Encouraged to continue with sleep hygiene measures, continue with evaluation with Dr. Michail Sermon

## 2021-03-29 NOTE — Progress Notes (Signed)
Procedures performed today:    None.  Independent interpretation of notes and tests performed by another provider:   None.  Brief History, Exam, Impression, and Recommendations:    BP 100/64   Pulse 73   Ht 5' 6" (1.676 m)   Wt 184 lb (83.5 kg)   SpO2 96%   BMI 29.70 kg/m   Atopic dermatitis Has not established with dermatology as of yet Was previously referred to dermatology.  Initial dermatologist had a long wait time and patient requested referral to alternative provider.  This was placed, however it appears patient had change of insurance and this may have affected the referral process.  She has not heard from the new dermatology office as of yet. Will send new referral to dermatology If patient does not hear from them within the next week, instructed to contact our office to follow-up on this  Insomnia Has made some changes regarding sleep hygiene measures as discussed at last appointment She feels that insomnia has improved At present, she feels that she falls asleep within 20 to 30 minutes.  This is improved as she was reporting that it could take hours for her to fall asleep.  She is also reporting about 8 hours of sleep on average per night which is much improved from before Has not met with Dr. Michail Sermon but still plans to do some, is scheduled for visit with him upcoming Encouraged to continue with sleep hygiene measures, continue with evaluation with Dr. Michail Sermon  Plan for follow-up in about 3 to 4 months.  Follow-up on prediabetes as well as iron deficiency anemia   ___________________________________________ Nellene Courtois de [place], MD, ABFM, CAQSM Primary Care and [place]

## 2021-03-29 NOTE — Assessment & Plan Note (Signed)
Has not established with dermatology as of yet Was previously referred to dermatology.  Initial dermatologist had a long wait time and patient requested referral to alternative provider.  This was placed, however it appears patient had change of insurance and this may have affected the referral process.  She has not heard from the new dermatology office as of yet. Will send new referral to dermatology If patient does not hear from them within the next week, instructed to contact our office to follow-up on this

## 2021-06-16 ENCOUNTER — Ambulatory Visit (INDEPENDENT_AMBULATORY_CARE_PROVIDER_SITE_OTHER): Payer: Managed Care, Other (non HMO) | Admitting: Medical

## 2021-06-16 ENCOUNTER — Encounter (HOSPITAL_BASED_OUTPATIENT_CLINIC_OR_DEPARTMENT_OTHER): Payer: Self-pay | Admitting: Medical

## 2021-06-16 ENCOUNTER — Other Ambulatory Visit: Payer: Self-pay

## 2021-06-16 ENCOUNTER — Other Ambulatory Visit (HOSPITAL_COMMUNITY)
Admission: RE | Admit: 2021-06-16 | Discharge: 2021-06-16 | Disposition: A | Discharge status: Home / Self Care | Payer: Managed Care, Other (non HMO) | Source: Ambulatory Visit | Attending: Medical | Admitting: Medical

## 2021-06-16 VITALS — BP 126/68 | Ht 66.25 in | Wt 179.0 lb

## 2021-06-16 DIAGNOSIS — Z01419 Encounter for gynecological examination (general) (routine) without abnormal findings: Secondary | ICD-10-CM | POA: Diagnosis not present

## 2021-06-16 DIAGNOSIS — R102 Pelvic and perineal pain: Secondary | ICD-10-CM

## 2021-06-16 DIAGNOSIS — Z124 Encounter for screening for malignant neoplasm of cervix: Secondary | ICD-10-CM | POA: Diagnosis present

## 2021-06-16 NOTE — Progress Notes (Signed)
History:  [name] is a 25 y.o. G0P0000 who presents to clinic today for annual exam with pap smear. She also states concern for intermittent vaginal discharge with pain and swelling x 6 months. She states discharge is thick, white when present, but not present today. She notes pain with intercourse and swelling afterwards when discharge is present. She denies abdominal pain, fever, or breast concerns today. LMP 06/13/21.    The following portions of the patient's history were reviewed and updated as appropriate: allergies, current medications, family history, past medical history, social history, past surgical history and problem list.  Review of Systems:  Review of Systems  Constitutional:  Negative for fever.  Gastrointestinal:  Negative for abdominal pain.  Genitourinary:        Neg - vaginal discharge  + spotting     Objective:  Physical Exam BP 126/68   Ht 5' 6.25" (1.683 m)   Wt 179 lb (81.2 kg)   BMI 28.67 kg/m  Physical Exam Vitals and nursing note reviewed. Exam conducted with a chaperone present.  Constitutional:      General: She is not in acute distress.    Appearance: She is well-developed and normal weight.  HENT:     Head: Normocephalic and atraumatic.  Cardiovascular:     Rate and Rhythm: Normal rate and regular rhythm.     Heart sounds: No murmur heard. Pulmonary:     Effort: Pulmonary effort is normal. No respiratory distress.     Breath sounds: Normal breath sounds. No wheezing.  Abdominal:     General: Abdomen is flat. Bowel sounds are normal. There is no distension.     Palpations: Abdomen is soft. There is no mass.     Tenderness: There is no abdominal tenderness. There is no guarding or rebound.  Genitourinary:    General: Normal vulva.     Labia:        Right: No rash, tenderness or lesion.        Left: No rash, tenderness or lesion.      Vagina: Bleeding (scant) present. No vaginal discharge.     Cervix: No cervical motion  tenderness, discharge or friability.     Uterus: Tender (mild). Not enlarged.      Adnexa:        Right: Tenderness (mild) present. No mass.         Left: No mass or tenderness.    Skin:    General: Skin is warm and dry.     Findings: No erythema.  Neurological:     Mental Status: She is alert and oriented to person, place, and time.    Health Maintenance Due  Topic Date Due   COVID-19 Vaccine (1) Never done   HPV VACCINES (1 - 2-dose series) Never done   Hepatitis C Screening  Never done   TETANUS/TDAP  Never done   PAP-Cervical Cytology Screening  Never done   PAP SMEAR-Modifier  Never done   INFLUENZA VACCINE  Never done     Assessment & Plan:  1. Cervical cancer screening - Pap smear today  - Will contact patient via MyChart with results   2. Vaginal pain - Patient is asymptomatic today - Discussed symptoms concerning for yeast infection, however unable to diagnose when asymptomatic  - Patient to call to return to clinic if symptoms resume - Consider OTC Monistat ovules and cream for symptomatic relief/treatment if symptoms return  Otherwise, follow-up in 1 year for annual exam  Approximately 15 minutes of total time was spent with this patient on history taking, patient education, physical exam, coordination or care and documentation.   Marny Lowenstein, PA-C 06/16/2021 10:19 AM

## 2021-06-19 LAB — CYTOLOGY - PAP: Diagnosis: NEGATIVE

## 2021-08-16 ENCOUNTER — Ambulatory Visit (HOSPITAL_BASED_OUTPATIENT_CLINIC_OR_DEPARTMENT_OTHER): Payer: Managed Care, Other (non HMO) | Admitting: Family Medicine

## 2021-08-23 ENCOUNTER — Other Ambulatory Visit: Payer: Self-pay

## 2021-08-23 ENCOUNTER — Encounter (HOSPITAL_BASED_OUTPATIENT_CLINIC_OR_DEPARTMENT_OTHER): Payer: Self-pay | Admitting: Family Medicine

## 2021-08-23 ENCOUNTER — Ambulatory Visit (INDEPENDENT_AMBULATORY_CARE_PROVIDER_SITE_OTHER): Payer: Managed Care, Other (non HMO) | Admitting: Family Medicine

## 2021-08-23 VITALS — BP 138/68 | HR 83 | Ht 65.25 in | Wt 184.0 lb

## 2021-08-23 DIAGNOSIS — L209 Atopic dermatitis, unspecified: Secondary | ICD-10-CM

## 2021-08-23 DIAGNOSIS — Z0000 Encounter for general adult medical examination without abnormal findings: Secondary | ICD-10-CM

## 2021-08-23 DIAGNOSIS — R42 Dizziness and giddiness: Secondary | ICD-10-CM | POA: Diagnosis not present

## 2021-08-23 DIAGNOSIS — H0011 Chalazion right upper eyelid: Secondary | ICD-10-CM | POA: Diagnosis not present

## 2021-08-23 DIAGNOSIS — H0019 Chalazion unspecified eye, unspecified eyelid: Secondary | ICD-10-CM | POA: Insufficient documentation

## 2021-08-23 NOTE — Progress Notes (Signed)
° ° °  Procedures performed today:    None.  Independent interpretation of notes and tests performed by another provider:   None.  Brief History, Exam, Impression, and Recommendations:    BP 138/68    Pulse 83    Ht 5' 5.25" (1.657 m)    Wt 184 lb (83.5 kg)    SpO2 100%    BMI 30.39 kg/m   Chalazion She reports having some swelling over her right upper eyelid for the past few days, not significantly painful, no discharge.  Denies any visual issues related to this.  Has not tried any specific treatments On exam, mild swelling within right upper eyelid, no significant erythema, no discharge, normal extraocular movements Eyelid issue appears most consistent with chalazion, discussed recommended conservative management, expect symptoms should improve gradually on their own If symptoms remain persistent for more than 6 to 8 weeks or become particularly bothersome, consider referral to ophthalmology for further evaluation and management  Atopic dermatitis Previously has been referred to dermatology, reports that she has not heard from dermatologist office as of yet Again provided information to dermatology office today for patient to contact the office and schedule evaluation  Dizziness Reports that first episode was about 1 week ago, has a current off-and-on.  Denies any associated activities, will occur randomly, may occur while she is standing.  Not associated with exertion.  Has not had any associated chest pain or shortness of breath.  Denies any presyncopal feelings or syncopal episodes Uncertain etiology, vital signs stable in office today We will check initial labs with upcoming wellness exam, have patient continue to monitor symptoms Does have history of anemia, possibly related to this  Plan for follow-up in about 1 to 2 months for CPE with nurse visit about 1 week prior for labs   ___________________________________________ Eh Sauseda de [place], MD, ABFM, CAQSM Primary Care and  Sports Medicine [facility]

## 2021-08-23 NOTE — Assessment & Plan Note (Signed)
Previously has been referred to dermatology, reports that she has not heard from dermatologist office as of yet Again provided information to dermatology office today for patient to contact the office and schedule evaluation

## 2021-08-23 NOTE — Patient Instructions (Signed)
°  Medication Instructions:  Your physician recommends that you continue on your current medications as directed. Please refer to the Current Medication list given to you today. --If you need a refill on any your medications before your next appointment, please call your pharmacy first. If no refills are authorized on file call the office.--  Follow-Up: Your next appointment:   Your physician recommends that you schedule a follow-up appointment in: 1-2 for CPE with Dr. de [place]  You will receive a text message or e-mail with a link to a survey about your care and experience with [place] today! We would greatly appreciate your feedback!   Thanks for letting us be apart of your health journey!!  Primary Care and Sports Medicine   Dr. Ceasar Mons [place]   We encourage you to activate your patient portal called "MyChart".  Sign up information is provided on this After Visit Summary.  MyChart is used to connect with patients for Virtual Visits (Telemedicine).  Patients are able to view lab/test results, encounter notes, upcoming appointments, etc.  Non-urgent messages can be sent to your provider as well. To learn more about what you can do with MyChart, please visit --  [email or web address].

## 2021-08-23 NOTE — Assessment & Plan Note (Signed)
She reports having some swelling over her right upper eyelid for the past few days, not significantly painful, no discharge.  Denies any visual issues related to this.  Has not tried any specific treatments On exam, mild swelling within right upper eyelid, no significant erythema, no discharge, normal extraocular movements Eyelid issue appears most consistent with chalazion, discussed recommended conservative management, expect symptoms should improve gradually on their own If symptoms remain persistent for more than 6 to 8 weeks or become particularly bothersome, consider referral to ophthalmology for further evaluation and management

## 2021-08-23 NOTE — Assessment & Plan Note (Signed)
Reports that first episode was about 1 week ago, has a current off-and-on.  Denies any associated activities, will occur randomly, may occur while she is standing.  Not associated with exertion.  Has not had any associated chest pain or shortness of breath.  Denies any presyncopal feelings or syncopal episodes Uncertain etiology, vital signs stable in office today We will check initial labs with upcoming wellness exam, have patient continue to monitor symptoms Does have history of anemia, possibly related to this

## 2021-10-18 ENCOUNTER — Ambulatory Visit (HOSPITAL_BASED_OUTPATIENT_CLINIC_OR_DEPARTMENT_OTHER): Payer: Managed Care, Other (non HMO)

## 2021-10-18 DIAGNOSIS — Z0000 Encounter for general adult medical examination without abnormal findings: Secondary | ICD-10-CM

## 2021-10-19 LAB — COMPREHENSIVE METABOLIC PANEL
ALT: 9 IU/L (ref 0–32)
AST: 16 IU/L (ref 0–40)
Albumin/Globulin Ratio: 2 (ref 1.2–2.2)
Albumin: 4.5 g/dL (ref 3.9–5.0)
Alkaline Phosphatase: 54 IU/L (ref 44–121)
BUN/Creatinine Ratio: 9 (ref 9–23)
BUN: 7 mg/dL (ref 6–20)
Bilirubin Total: 0.2 mg/dL (ref 0.0–1.2)
CO2: 20 mmol/L (ref 20–29)
Calcium: 9.1 mg/dL (ref 8.7–10.2)
Chloride: 105 mmol/L (ref 96–106)
Creatinine, Ser: 0.76 mg/dL (ref 0.57–1.00)
Globulin, Total: 2.3 g/dL (ref 1.5–4.5)
Glucose: 83 mg/dL (ref 70–99)
Potassium: 4 mmol/L (ref 3.5–5.2)
Sodium: 138 mmol/L (ref 134–144)
Total Protein: 6.8 g/dL (ref 6.0–8.5)
eGFR: 111 mL/min/{1.73_m2} (ref >59)

## 2021-10-19 LAB — TSH RFX ON ABNORMAL TO FREE T4: TSH: 1.83 u[IU]/mL (ref 0.450–4.500)

## 2021-10-19 LAB — CBC WITH DIFFERENTIAL/PLATELET
Basophils Absolute: 0 10*3/uL (ref 0.0–0.2)
Basos: 1 %
EOS (ABSOLUTE): 0.1 10*3/uL (ref 0.0–0.4)
Eos: 3 %
Hematocrit: 32 % — ABNORMAL LOW (ref 34.0–46.6)
Hemoglobin: 9.5 g/dL — ABNORMAL LOW (ref 11.1–15.9)
Immature Grans (Abs): 0 10*3/uL (ref 0.0–0.1)
Immature Granulocytes: 0 %
Lymphocytes Absolute: 1.9 10*3/uL (ref 0.7–3.1)
Lymphs: 51 %
MCH: 22.2 pg — ABNORMAL LOW (ref 26.6–33.0)
MCHC: 29.7 g/dL — ABNORMAL LOW (ref 31.5–35.7)
MCV: 75 fL — ABNORMAL LOW (ref 79–97)
Monocytes Absolute: 0.4 10*3/uL (ref 0.1–0.9)
Monocytes: 10 %
Neutrophils Absolute: 1.3 10*3/uL — ABNORMAL LOW (ref 1.4–7.0)
Neutrophils: 35 %
Platelets: 258 10*3/uL (ref 150–450)
RBC: 4.28 x10E6/uL (ref 3.77–5.28)
RDW: 15 % (ref 11.7–15.4)
WBC: 3.6 10*3/uL (ref 3.4–10.8)

## 2021-10-19 LAB — HEMOGLOBIN A1C
Est. average glucose Bld gHb Est-mCnc: 120 mg/dL
Hgb A1c MFr Bld: 5.8 % — ABNORMAL HIGH (ref 4.8–5.6)

## 2021-10-19 LAB — LIPID PANEL
Chol/HDL Ratio: 3.1 ratio (ref 0.0–4.4)
Cholesterol, Total: 214 mg/dL — ABNORMAL HIGH (ref 100–199)
HDL: 68 mg/dL (ref >39)
LDL Chol Calc (NIH): 138 mg/dL — ABNORMAL HIGH (ref 0–99)
Triglycerides: 48 mg/dL (ref 0–149)
VLDL Cholesterol Cal: 8 mg/dL (ref 5–40)

## 2021-10-24 ENCOUNTER — Encounter (HOSPITAL_BASED_OUTPATIENT_CLINIC_OR_DEPARTMENT_OTHER): Payer: Self-pay | Admitting: Family Medicine

## 2021-10-24 ENCOUNTER — Other Ambulatory Visit (HOSPITAL_BASED_OUTPATIENT_CLINIC_OR_DEPARTMENT_OTHER): Payer: Self-pay

## 2021-10-24 ENCOUNTER — Ambulatory Visit (INDEPENDENT_AMBULATORY_CARE_PROVIDER_SITE_OTHER): Payer: Managed Care, Other (non HMO) | Admitting: Family Medicine

## 2021-10-24 DIAGNOSIS — D509 Iron deficiency anemia, unspecified: Secondary | ICD-10-CM | POA: Insufficient documentation

## 2021-10-24 DIAGNOSIS — Z0000 Encounter for general adult medical examination without abnormal findings: Secondary | ICD-10-CM | POA: Diagnosis not present

## 2021-10-24 MED ORDER — TRIPROLIDINE-PSE 2.5-60 MG PO TABS: 1.0000 | ORAL_TABLET | Freq: Four times a day (QID) | ORAL | 1 refills | Status: DC | PRN

## 2021-10-24 MED ORDER — FERROUS SULFATE 325 (65 FE) MG PO TBEC: 325.0000 mg | DELAYED_RELEASE_TABLET | ORAL | 0 refills | Status: DC

## 2021-10-24 NOTE — Patient Instructions (Signed)
Iron Deficiency Anemia, Adult Iron deficiency anemia is when you do not have enough red blood cells or hemoglobin in your blood. This happens because you have too little iron in your body. Hemoglobin carries oxygen to parts of the body. Anemia can cause yourbody to not get enough oxygen. What are the causes? Not eating enough foods that have iron in them. The body not being able to take in iron well. Needing more iron due to pregnancy or heavy menstrual periods, for females. Cancer. Bleeding in the bowels. Many blood draws. What increases the risk? Being pregnant. Being a teenage girl going through a growth spurt. What are the signs or symptoms? Pale skin, lips, and nails. Weakness, dizziness, and getting tired easily. Headache. Feeling like you cannot breathe well when moving (shortness of breath). Cold hands and feet. Fast heartbeat or a heartbeat that is not regular. Feeling grouchy (irritable) or breathing fast. These are more common in very bad anemia. Mild anemia may not cause any symptoms. How is this treated? This condition is treated by finding out why you do not have enough iron and then getting more iron. It may include: Adding foods to your diet that have a lot of iron. Taking iron pills (supplements). If you are pregnant or breastfeeding, you may need to take extra iron. Your diet often does not provide the amount of iron that you need. Getting more vitamin C in your diet. Vitamin C helps your body take in iron. You may need to take iron pills with a glass of orange juice or vitamin C pills. Medicines to make heavy menstrual periods lighter. Surgery. You may need blood tests to see if treatment is working. If the treatment doesnot seem to be working, you may need more tests. Follow these instructions at home: Medicines Take over-the-counter and prescription medicines only as told by your doctor. This includes iron pills and vitamins. Take iron pills when your stomach is  empty. If you cannot handle this, take them with food. Do not drink milk or take antacids at the same time as your iron pills. Iron pills may turn your poop (stool)black. If you cannot handle taking iron pills by mouth, ask your doctor about getting iron through: An IV tube. A shot (injection) into a muscle. Eating and drinking  Talk with your doctor before changing the foods you eat. He or she may tell you to eat foods that have a lot of iron, such as: Liver. Low-fat (lean) beef. Breads and cereals that have iron added to them. Eggs. Dried fruit. Dark green, leafy vegetables. Eat fresh fruits and vegetables that are high in vitamin C. They help your body use iron. Foods with a lot of vitamin C include: Oranges. Peppers. Tomatoes. Mangoes. Drink enough fluid to keep your pee (urine) pale yellow.  Managing constipation If you are taking iron pills, they may cause trouble pooping (constipation). To prevent or treat trouble pooping, you may need to: Take over-the-counter or prescription medicines. Eat foods that are high in fiber. These include beans, whole grains, and fresh fruits and vegetables. Limit foods that are high in fat and sugar. These include fried or sweet foods. General instructions Return to your normal activities as told by your doctor. Ask your doctor what activities are safe for you. Keep yourself clean, and keep things clean around you. Keep all follow-up visits as told by your doctor. This is important. Contact a doctor if: You feel like you may vomit (nauseous), or you vomit. You feel   weak. You are sweating for no reason. You have trouble pooping, such as: Pooping less than 3 times a week. Straining to poop. Having poop that is hard, dry, or larger than normal. Feeling full or bloated. Pain in the lower belly. Not feeling better after pooping. Get help right away if: You pass out (faint). You have chest pain. You have trouble breathing that: Is very  bad. Gets worse with physical activity. You have a fast heartbeat, or a heartbeat that does not feel regular. You get light-headed when getting up from sitting or lying down. These symptoms may be an emergency. Do not wait to see if the symptoms will go away. Get medical help right away. Call your local emergency services (911 in the [place] Do not drive yourself to the hospital. Summary Iron deficiency anemia is when you have too little iron in your body. This condition is treated by finding out why you do not have enough iron in your body and then getting more iron. Take over-the-counter and prescription medicines only as told by your doctor. Eat fresh fruits and vegetables that are high in vitamin C. Get help right away if you cannot breathe well. This information is not intended to replace advice given to you by your health care provider. Make sure you discuss any questions you have with your healthcare provider. Document Revised: 03/10/2019 Document Reviewed: 03/10/2019 Elsevier Patient Education  2022 [employer] Inc[employer]  

## 2021-10-24 NOTE — Assessment & Plan Note (Signed)
Chronic anemia with iron deficiency on labs in the past ?Will have patient start oral iron supplement, M-W-F dosing to try to minimize side effects ?Plan for follow-up in 3 months to monitor progress, likely check CBC and iron studies at that time ?

## 2021-10-24 NOTE — Assessment & Plan Note (Signed)
Routine HCM labs reviewed. HCM reviewed/discussed. Anticipatory guidance regarding healthy weight, lifestyle and choices given. ?Recommend healthy diet.  Recommend approximately 150 minutes/week of moderate intensity exercise ?Recommend regular dental and vision exams ?Always use seatbelt/lap and shoulder restraints ?Recommend using smoke alarms and checking batteries at least twice a year ?Recommend using sunscreen when outside ?Iron deficiency noted - discussed separately ?

## 2021-10-24 NOTE — Progress Notes (Signed)
?Subjective:   ? ?CC: Annual Physical Exam ? ?HPI:  ?Lorriane Dehart is a 26 y.o. presenting for annual physical ? ?I reviewed the past medical history, family history, social history, surgical history, and allergies today and no changes were needed.  Please see the problem list section below in epic for further details. ? ?Past Medical History: ?Past Medical History:  ?Diagnosis Date  ? Syncope and collapse   ? ?Past Surgical History: ?History reviewed. No pertinent surgical history. ?Social History: ?Social History  ? ?Socioeconomic History  ? Marital status: Single  ?  Spouse name: Not on file  ? Number of children: Not on file  ? Years of education: Not on file  ? Highest education level: Not on file  ?Occupational History  ? Not on file  ?Tobacco Use  ? Smoking status: Never  ? Smokeless tobacco: Never  ?Vaping Use  ? Vaping Use: Never used  ?Substance and Sexual Activity  ? Alcohol use: No  ? Drug use: No  ? Sexual activity: Yes  ?  Birth control/protection: None  ?Other Topics Concern  ? Not on file  ?Social History Narrative  ? Not on file  ? ?Social Determinants of Health  ? ?Financial Resource Strain: Not on file  ?Food Insecurity: Not on file  ?Transportation Needs: Not on file  ?Physical Activity: Not on file  ?Stress: Not on file  ?Social Connections: Not on file  ? ?Family History: ?Family History  ?Problem Relation Age of Onset  ? Diabetes Paternal Grandmother   ? Diabetes Maternal Grandmother   ? Heart disease Father   ? Hypertension Father   ? Kidney disease Father   ? Hypertension Mother   ? ?Allergies: ?No Known Allergies ?Medications: See med rec. ? ?Review of Systems: No headache, visual changes, nausea, vomiting, diarrhea, constipation, dizziness, abdominal pain, skin rash, fevers, chills, night sweats, swollen lymph nodes, weight loss, chest pain, body aches, joint swelling, muscle aches, shortness of breath, mood changes, visual or auditory hallucinations. ? ?Objective:   ? ?BP 138/82    Pulse 86   Temp 98.4 ?F (36.9 ?C)   Ht 5\' 5"  (1.651 m)   Wt 191 lb 9.6 oz (86.9 kg)   SpO2 99%   BMI 31.88 kg/m?  ? ?General: Well Developed, well nourished, and in no acute distress.  ?Neuro: Alert and oriented x3, extra-ocular muscles intact, sensation grossly intact. Cranial nerves II through XII are intact, motor, sensory, and coordinative functions are all intact. ?HEENT: Normocephalic, atraumatic, pupils equal round reactive to light, neck supple, no masses, no lymphadenopathy, thyroid nonpalpable. Oropharynx, nasopharynx, external ear canals are unremarkable. ?Skin: Warm and dry, no rashes noted.  ?Cardiac: Regular rate and rhythm, no murmurs rubs or gallops.  ?Respiratory: Clear to auscultation bilaterally. Not using accessory muscles, speaking in full sentences.  ?Abdominal: Soft, nontender, nondistended, positive bowel sounds, no masses, no organomegaly.  ?Musculoskeletal: Shoulder, elbow, wrist, hip, knee, ankle stable, and with full range of motion. ? ?Impression and Recommendations:   ? ?Iron deficiency anemia ?Chronic anemia with iron deficiency on labs in the past ?Will have patient start oral iron supplement, M-W-F dosing to try to minimize side effects ?Plan for follow-up in 3 months to monitor progress, likely check CBC and iron studies at that time ? ?Wellness examination ?Routine HCM labs reviewed. HCM reviewed/discussed. Anticipatory guidance regarding healthy weight, lifestyle and choices given. ?Recommend healthy diet.  Recommend approximately 150 minutes/week of moderate intensity exercise ?Recommend regular dental and vision exams ?Always  use seatbelt/lap and shoulder restraints ?Recommend using smoke alarms and checking batteries at least twice a year ?Recommend using sunscreen when outside ?Iron deficiency noted - discussed separately ? ?F/u in 3 months for IDA ? ? ?___________________________________________ ?Myka Lukins de [place], MD, ABFM, CAQSM ?Primary Care and Sports Medicine ?[facility] ?

## 2021-11-20 ENCOUNTER — Other Ambulatory Visit (HOSPITAL_BASED_OUTPATIENT_CLINIC_OR_DEPARTMENT_OTHER): Payer: Self-pay | Admitting: Family Medicine

## 2021-11-22 NOTE — Telephone Encounter (Signed)
Ok thanks I will reach out to the pt and inform her. ?

## 2022-01-10 ENCOUNTER — Encounter (HOSPITAL_BASED_OUTPATIENT_CLINIC_OR_DEPARTMENT_OTHER): Payer: Self-pay | Admitting: Family Medicine

## 2022-01-21 ENCOUNTER — Other Ambulatory Visit (HOSPITAL_BASED_OUTPATIENT_CLINIC_OR_DEPARTMENT_OTHER): Payer: Self-pay | Admitting: Family Medicine

## 2022-01-23 ENCOUNTER — Ambulatory Visit (HOSPITAL_BASED_OUTPATIENT_CLINIC_OR_DEPARTMENT_OTHER): Payer: Managed Care, Other (non HMO) | Admitting: Family Medicine

## 2022-02-02 ENCOUNTER — Ambulatory Visit (HOSPITAL_BASED_OUTPATIENT_CLINIC_OR_DEPARTMENT_OTHER): Payer: Self-pay | Admitting: Family Medicine

## 2022-02-06 ENCOUNTER — Ambulatory Visit (HOSPITAL_BASED_OUTPATIENT_CLINIC_OR_DEPARTMENT_OTHER): Payer: Managed Care, Other (non HMO) | Admitting: Family Medicine

## 2022-02-06 ENCOUNTER — Encounter (HOSPITAL_BASED_OUTPATIENT_CLINIC_OR_DEPARTMENT_OTHER): Payer: Self-pay | Admitting: Family Medicine

## 2022-08-16 ENCOUNTER — Ambulatory Visit (HOSPITAL_BASED_OUTPATIENT_CLINIC_OR_DEPARTMENT_OTHER): Payer: Managed Care, Other (non HMO) | Admitting: Family Medicine

## 2022-08-16 ENCOUNTER — Encounter (HOSPITAL_BASED_OUTPATIENT_CLINIC_OR_DEPARTMENT_OTHER): Payer: Self-pay

## 2023-08-31 ENCOUNTER — Other Ambulatory Visit: Payer: Self-pay

## 2023-08-31 ENCOUNTER — Encounter (HOSPITAL_BASED_OUTPATIENT_CLINIC_OR_DEPARTMENT_OTHER): Payer: Self-pay

## 2023-08-31 ENCOUNTER — Emergency Department (HOSPITAL_BASED_OUTPATIENT_CLINIC_OR_DEPARTMENT_OTHER)
Admission: EM | Admit: 2023-08-31 | Discharge: 2023-08-31 | Disposition: A | Discharge status: Home / Self Care | Payer: Managed Care, Other (non HMO) | Attending: Emergency Medicine | Admitting: Emergency Medicine

## 2023-08-31 ENCOUNTER — Emergency Department (HOSPITAL_BASED_OUTPATIENT_CLINIC_OR_DEPARTMENT_OTHER): Payer: Managed Care, Other (non HMO)

## 2023-08-31 DIAGNOSIS — O468X1 Other antepartum hemorrhage, first trimester: Secondary | ICD-10-CM | POA: Insufficient documentation

## 2023-08-31 DIAGNOSIS — O208 Other hemorrhage in early pregnancy: Secondary | ICD-10-CM

## 2023-08-31 DIAGNOSIS — O209 Hemorrhage in early pregnancy, unspecified: Secondary | ICD-10-CM | POA: Diagnosis present

## 2023-08-31 DIAGNOSIS — Z3A01 Less than 8 weeks gestation of pregnancy: Secondary | ICD-10-CM | POA: Insufficient documentation

## 2023-08-31 LAB — URINALYSIS, ROUTINE W REFLEX MICROSCOPIC
Bacteria, UA: NONE SEEN
Bilirubin Urine: NEGATIVE
Glucose, UA: NEGATIVE mg/dL
Ketones, ur: NEGATIVE mg/dL
Nitrite: NEGATIVE
Specific Gravity, Urine: 1.026 (ref 1.005–1.030)
pH: 6 (ref 5.0–8.0)

## 2023-08-31 LAB — CBC
HCT: 31.5 % — ABNORMAL LOW (ref 36.0–46.0)
Hemoglobin: 9.6 g/dL — ABNORMAL LOW (ref 12.0–15.0)
MCH: 22.2 pg — ABNORMAL LOW (ref 26.0–34.0)
MCHC: 30.5 g/dL (ref 30.0–36.0)
MCV: 72.9 fL — ABNORMAL LOW (ref 80.0–100.0)
Platelets: 248 10*3/uL (ref 150–400)
RBC: 4.32 MIL/uL (ref 3.87–5.11)
RDW: 15.9 % — ABNORMAL HIGH (ref 11.5–15.5)
WBC: 5 10*3/uL (ref 4.0–10.5)
nRBC: 0 % (ref 0.0–0.2)

## 2023-08-31 LAB — BASIC METABOLIC PANEL
Anion gap: 10 (ref 5–15)
BUN: 7 mg/dL (ref 6–20)
CO2: 20 mmol/L — ABNORMAL LOW (ref 22–32)
Calcium: 9.1 mg/dL (ref 8.9–10.3)
Chloride: 106 mmol/L (ref 98–111)
Creatinine, Ser: 0.62 mg/dL (ref 0.44–1.00)
GFR, Estimated: >60 mL/min (ref >60)
Glucose, Bld: 117 mg/dL — ABNORMAL HIGH (ref 70–99)
Potassium: 3.7 mmol/L (ref 3.5–5.1)
Sodium: 136 mmol/L (ref 135–145)

## 2023-08-31 LAB — ABO/RH: ABO/RH(D): A POS

## 2023-08-31 LAB — HCG, QUANTITATIVE, PREGNANCY: hCG, Beta Chain, Quant, S: 39117 m[IU]/mL — ABNORMAL HIGH (ref <5)

## 2023-08-31 NOTE — ED Triage Notes (Signed)
Patient arrives with complaints of one episode of vaginal bleeding yesterday (resolved). Patient reports that she is around [redacted] weeks pregnant as well. No pain.

## 2023-08-31 NOTE — ED Provider Notes (Signed)
[place] EMERGENCY DEPARTMENT AT [facility] Provider Note   CSN: [id number] Arrival date & time: 08/31/23  1321     History  Chief Complaint  Patient presents with   Vaginal Bleeding   Pregnant    Nicole Benton is a 28 y.o. female.  Presenting to the ED for evaluation of vaginal bleeding.  She reports 1 episode of vaginal bleeding yesterday.  Describes this as short episode of moderate bleeding, 1 small clot.  She is not bleeding anymore.  States she is around [redacted] weeks pregnant by last menstrual period which was approximately 07/20/2023.  She denies any abdominal pain or cramping.  This is her first pregnancy.  She denies dysuria, frequency, urgency.  She denies discharge other than blood.   Vaginal Bleeding      Home Medications Prior to Admission medications   Medication Sig Start Date End Date Taking? Authorizing Provider  ferrous sulfate 325 (65 FE) MG EC tablet TAKE 1 TABLET (325 MG TOTAL) BY MOUTH EVERY MONDAY, WEDNESDAY, AND FRIDAY 01/22/22   [place], Buren Kos, MD  triprolidine-pseudoephedrine (APRODINE) 2.5-60 MG TABS tablet Take 1 tablet by mouth every 6 (six) hours as needed for allergies. 10/24/21   [place], Raymond J, MD      Allergies    Patient has no known allergies.    Review of Systems   Review of Systems  Genitourinary:  Positive for vaginal bleeding.  All other systems reviewed and are negative.   Physical Exam Updated Vital Signs BP 135/69   Pulse 82   Temp 97.8 F (36.6 C) (Oral)   Resp 20   Ht 5\' 5"  (1.651 m)   Wt 86.2 kg   LMP 07/20/2023   SpO2 100%   BMI 31.62 kg/m  Physical Exam Vitals and nursing note reviewed.  Constitutional:      General: She is not in acute distress.    Appearance: Normal appearance. She is normal weight. She is not ill-appearing.  HENT:     Head: Normocephalic and atraumatic.  Pulmonary:     Effort: Pulmonary effort is normal. No respiratory distress.  Abdominal:     General: Abdomen is  flat. There is no distension.     Tenderness: There is no abdominal tenderness. There is no guarding.  Musculoskeletal:        General: Normal range of motion.     Cervical back: Neck supple.  Skin:    General: Skin is warm and dry.  Neurological:     Mental Status: She is alert and oriented to person, place, and time.  Psychiatric:        Mood and Affect: Mood normal.        Behavior: Behavior normal.     ED Results / Procedures / Treatments   Labs (all labs ordered are listed, but only abnormal results are displayed) Labs Reviewed  CBC - Abnormal; Notable for the following components:      Result Value   Hemoglobin 9.6 (*)    HCT 31.5 (*)    MCV 72.9 (*)    MCH 22.2 (*)    RDW 15.9 (*)    All other components within normal limits  HCG, QUANTITATIVE, PREGNANCY - Abnormal; Notable for the following components:   hCG, Beta Chain, Quant, S 39,117 (*)    All other components within normal limits  URINALYSIS, ROUTINE W REFLEX MICROSCOPIC - Abnormal; Notable for the following components:   Hgb urine dipstick TRACE (*)  Protein, ur TRACE (*)    Leukocytes,Ua SMALL (*)    All other components within normal limits  BASIC METABOLIC PANEL - Abnormal; Notable for the following components:   CO2 20 (*)    Glucose, Bld 117 (*)    All other components within normal limits  ABO/RH    EKG None  Radiology US OB LESS THAN 14 WEEKS WITH OB TRANSVAGINAL Result Date: 08/31/2023 CLINICAL DATA:  [id number] Vaginal bleeding [id number] EXAM: OBSTETRIC <14 WK [place] AND TRANSVAGINAL OB US TECHNIQUE: Both transabdominal and transvaginal ultrasound examinations were performed for complete evaluation of the gestation as well as the maternal uterus, adnexal regions, and pelvic cul-de-sac. Transvaginal technique was performed to assess early pregnancy. COMPARISON:  None Available. FINDINGS: Intrauterine gestational sac: Single Yolk sac:  Visualized. Embryo:  Visualized. Cardiac Activity: Visualized. Heart  Rate: 127 bpm CRL:  5.3 mm   6 w   2 d                  [place] EDC: 04/23/2024 Subchorionic hemorrhage:  Small. Maternal uterus/adnexae: Uterine fundal fibroid measuring up to 5.8 cm in diameter. Ovaries within normal limits. Trace free fluid within the pelvis. IMPRESSION: 1. Single live intrauterine gestation measuring 6 weeks 2 days by crown-rump length. 2. Active heart tones at 127 BPM. 3. Small subchronic hemorrhage. 4. Uterine fundal fibroid measuring 5.8 cm. Electronically Signed   By: Duanne Guess D.O.   On: 08/31/2023 17:14    Procedures Procedures    Medications Ordered in ED Medications - No data to display  ED Course/ Medical Decision Making/ A&P Clinical Course as of 08/31/23 1812  Sat Aug 31, 2023  1724 Temp: 97.8 F (36.6 C) [MT]    Clinical Course User Index [MT] Terald Sleeper, MD                                 Medical Decision Making Amount and/or Complexity of Data Reviewed Labs: ordered. Radiology: ordered.  This patient presents to the ED for concern of vaginal bleeding in pregnancy, this involves an extensive number of treatment options, and is a complaint that carries with it a high risk of complications and morbidity.  The differential diagnosis for vaginal bleeding in pregnancy less that 20 weeks includes but is not limited too the following: ectopic pregnancy, subchorionic hematoma, first trimester abortion (Complete Abortion, Incomplete Abortion, Inevitable Abortion, Missed Abortion, Septic abortion, Threatened Abortion),Gestational trophoblastic disease, Heterotopic pregnancy, Implantation bleeding, Molar pregnancy, Non-pregnancy related bleeding (Cervicitis, Fibroids, Vaginal laceration).   My initial workup includes labs, ultrasound.  Additional history obtained from: Nursing notes from this visit.  I ordered, reviewed and interpreted labs which include: CBC, hCG, urinalysis, BMP, ABO/Rh.  Stable anemia with a hemoglobin of 9.6 from baseline of  approximately 10.  I ordered imaging studies including ultrasound. I independently visualized and interpreted imaging which showed subchorionic hemorrhage, single live intrauterine gestation measuring 6 weeks and 2 days, active heart tones at 127, uterine fibroid I agree with the radiologist interpretation  Afebrile, hemodynamically stable.  28 year old female presenting to the ED for evaluation of vaginal bleeding in pregnancy.  She denies any abdominal pain.  She had 1 episode of vomiting and has not had any since.  She is not established with an OB provider.  Lab workup reassuring, hemoglobin near baseline.  Ultrasound revealing subchorionic hemorrhage, likely source of bleeding.  Fetal heart tones 127.  Patient was encouraged  to follow-up with OB/GYN and given contact information.  She was given return precautions.  Stable at discharge.  At this time there does not appear to be any evidence of an acute emergency medical condition and the patient appears stable for discharge with appropriate outpatient follow up. Diagnosis was discussed with patient who verbalizes understanding of care plan and is agreeable to discharge. I have discussed return precautions with patient who verbalizes understanding. Patient encouraged to follow-up with their PCP within 1 week. All questions answered.  Patient's case discussed with Dr. Renaye Rakers who agrees with plan to discharge with follow-up.   Note: Portions of this report may have been transcribed using voice recognition software. Every effort was made to ensure accuracy; however, inadvertent computerized transcription errors may still be present.         Final Clinical Impression(s) / ED Diagnoses Final diagnoses:  [redacted] weeks gestation of pregnancy  Subchorionic hemorrhage of placenta in first trimester    Rx / DC Orders ED Discharge Orders     None         Mora Bellman 08/31/23 1812    Terald Sleeper, MD 08/31/23 2322

## 2023-08-31 NOTE — Discharge Instructions (Signed)
You have been seen today for your complaint of vaginal bleeding in pregnancy. Your lab work was reassuring. Your imaging showed a subchorionic hemorrhage. Follow up with: An OB provider for reassessment and likely repeat imaging and labs.  You may call any OB office, [facility] Please seek immediate medical care if you develop any of the following symptoms: You have severe cramps in your stomach, back, abdomen, or pelvis. You pass large clots or tissue. Save any tissue for your health care provider to look at. You faint. You become light-headed or weak. At this time there does not appear to be the presence of an emergent medical condition, however there is always the potential for conditions to change. Please read and follow the below instructions.  Do not take your medicine if  develop an itchy rash, swelling in your mouth or lips, or difficulty breathing; call 911 and seek immediate emergency medical attention if this occurs.  You may review your lab tests and imaging results in their entirety on your MyChart account.  Please discuss all results of fully with your primary care provider and other specialist at your follow-up visit.  Note: Portions of this text may have been transcribed using voice recognition software. Every effort was made to ensure accuracy; however, inadvertent computerized transcription errors may still be present.

## 2023-08-31 NOTE — ED Notes (Signed)
 Dc instructions reviewed with patient. Patient voiced understanding. Dc with belongings.

## 2023-11-12 ENCOUNTER — Inpatient Hospital Stay (HOSPITAL_COMMUNITY)
Admission: AD | Admit: 2023-11-12 | Discharge: 2023-11-12 | Disposition: A | Discharge status: Home / Self Care | Source: Home / Self Care | Attending: Obstetrics and Gynecology | Admitting: Obstetrics and Gynecology

## 2023-11-12 ENCOUNTER — Encounter (HOSPITAL_COMMUNITY): Payer: Self-pay | Admitting: *Deleted

## 2023-11-12 ENCOUNTER — Inpatient Hospital Stay (HOSPITAL_BASED_OUTPATIENT_CLINIC_OR_DEPARTMENT_OTHER)

## 2023-11-12 DIAGNOSIS — R109 Unspecified abdominal pain: Secondary | ICD-10-CM

## 2023-11-12 DIAGNOSIS — O3412 Maternal care for benign tumor of corpus uteri, second trimester: Secondary | ICD-10-CM | POA: Insufficient documentation

## 2023-11-12 DIAGNOSIS — O42912 Preterm premature rupture of membranes, unspecified as to length of time between rupture and onset of labor, second trimester: Secondary | ICD-10-CM | POA: Diagnosis not present

## 2023-11-12 DIAGNOSIS — Z369 Encounter for antenatal screening, unspecified: Secondary | ICD-10-CM | POA: Insufficient documentation

## 2023-11-12 DIAGNOSIS — D259 Leiomyoma of uterus, unspecified: Secondary | ICD-10-CM | POA: Insufficient documentation

## 2023-11-12 DIAGNOSIS — Z3A17 17 weeks gestation of pregnancy: Secondary | ICD-10-CM | POA: Diagnosis not present

## 2023-11-12 DIAGNOSIS — Z3A16 16 weeks gestation of pregnancy: Secondary | ICD-10-CM

## 2023-11-12 DIAGNOSIS — O021 Missed abortion: Secondary | ICD-10-CM | POA: Diagnosis not present

## 2023-11-12 DIAGNOSIS — O99891 Other specified diseases and conditions complicating pregnancy: Secondary | ICD-10-CM | POA: Diagnosis not present

## 2023-11-12 DIAGNOSIS — O26899 Other specified pregnancy related conditions, unspecified trimester: Secondary | ICD-10-CM

## 2023-11-12 DIAGNOSIS — O99212 Obesity complicating pregnancy, second trimester: Secondary | ICD-10-CM | POA: Insufficient documentation

## 2023-11-12 LAB — URINALYSIS, ROUTINE W REFLEX MICROSCOPIC
Bilirubin Urine: NEGATIVE
Glucose, UA: NEGATIVE mg/dL
Hgb urine dipstick: NEGATIVE
Ketones, ur: NEGATIVE mg/dL
Nitrite: NEGATIVE
Protein, ur: NEGATIVE mg/dL
Specific Gravity, Urine: 1.017 (ref 1.005–1.030)
pH: 6 (ref 5.0–8.0)

## 2023-11-12 MED ORDER — OXYCODONE-ACETAMINOPHEN 5-325 MG PO TABS
1.0000 | ORAL_TABLET | Freq: Once | ORAL | Status: AC
  Administered 2023-11-12: 1 via ORAL
  Filled 2023-11-12: qty 1

## 2023-11-12 MED ORDER — ACETAMINOPHEN ER 650 MG PO TBCR: 650.0000 mg | EXTENDED_RELEASE_TABLET | Freq: Three times a day (TID) | ORAL | Status: DC | PRN

## 2023-11-12 NOTE — MAU Note (Signed)
 Nicole Benton is a 28 y.o. at Unknown here in MAU reporting: is pregnant and having pain from her fibroids.  Was given medication, but it is not working, so her dr sent her in for IV pain medication. Pain feels like a contraction.  Denies bleeding or d/c.    Onset of complaint: ongoing problem Pain score: severe Vitals:   11/12/23 1213  BP: 107/62  Pulse: 91  Resp: 17  Temp: 98.8 F (37.1 C)  SpO2: 100%     FHT:154 Lab orders placed from triage:  urine

## 2023-11-12 NOTE — MAU Provider Note (Signed)
 History    CSN: [id number] Arrival date and time: 11/12/23 1145   Event Date/Time   First Provider Initiated Contact with Patient 11/12/23 1228      Chief Complaint  Patient presents with   Abdominal Pain   Patient is 28 y.o. G1P0000 [redacted]w[redacted]d here with complaints of abdominal pain, feels like contractions. Known fibroids, last in our system fibroid was 5.8 cm (Feb 2025). Pain started to worsened about 1 month ago. She was prescribed tramadol  by her OB. This has not helped. Last took at 10 PM last night.  Reports she has noted more pain as her pregnancy progresses but much worse now. Denies nausea/vomiting. Denies dysuria/polyuria. Denies flank pain. Last BM yesterday. Denies fever  +FM, denies LOF, VB, contractions, vaginal discharge.  Abdominal Pain Pertinent negatives include no diarrhea, dysuria, fever, nausea or vomiting.    OB History     Gravida  1   Para  0   Term  0   Preterm  0   AB  0   Living  0      SAB  0   IAB  0   Ectopic  0   Multiple  0   Live Births  0           Past Medical History:  Diagnosis Date   Syncope and collapse     History reviewed. No pertinent surgical history.  Family History  Problem Relation Age of Onset   Diabetes Paternal Grandmother    Diabetes Maternal Grandmother    Heart disease Father    Hypertension Father    Kidney disease Father    Hypertension Mother     Social History   Tobacco Use   Smoking status: Never   Smokeless tobacco: Never  Vaping Use   Vaping status: Never Used  Substance Use Topics   Alcohol use: No   Drug use: No    Allergies: No Known Allergies  Medications Prior to Admission  Medication Sig Dispense Refill Last Dose/Taking   ferrous sulfate  325 (65 FE) MG EC tablet TAKE 1 TABLET (325 MG TOTAL) BY MOUTH EVERY MONDAY, WEDNESDAY, AND FRIDAY 60 tablet 0 11/11/2023   Prenatal Vit-Fe Fumarate-FA (PRENATAL MULTIVITAMIN) TABS tablet Take 1 tablet by mouth daily at 12 noon.   11/11/2023    traMADol  (ULTRAM ) 50 MG tablet Take 50 mg by mouth every 6 (six) hours as needed.   11/11/2023 at 10:00 PM   triprolidine -pseudoephedrine (APRODINE) 2.5-60 MG TABS tablet Take 1 tablet by mouth every 6 (six) hours as needed for allergies. 56 tablet 1 11/11/2023    Review of Systems  Constitutional:  Negative for chills and fever.  HENT:  Negative for congestion and sore throat.   Eyes:  Negative for pain and visual disturbance.  Respiratory:  Negative for cough, chest tightness and shortness of breath.   Cardiovascular:  Negative for chest pain.  Gastrointestinal:  Positive for abdominal pain. Negative for diarrhea, nausea and vomiting.  Endocrine: Negative for cold intolerance and heat intolerance.  Genitourinary:  Negative for dysuria and flank pain.  Musculoskeletal:  Negative for back pain.  Skin:  Negative for rash.  Allergic/Immunologic: Negative for food allergies.  Neurological:  Negative for dizziness and light-headedness.  Psychiatric/Behavioral:  Negative for agitation.    Physical Exam   Blood pressure 120/68, pulse 96, temperature 98.8 F (37.1 C), temperature source Oral, resp. rate 17, height 5\' 6"  (1.676 m), weight 92.2 kg, last menstrual period 07/20/2023, SpO2 100%.  Physical Exam  Vitals and nursing note reviewed.  Constitutional:      General: She is not in acute distress.    Appearance: She is well-developed.     Comments: Pregnant female  HENT:     Head: Normocephalic and atraumatic.  Eyes:     General: No scleral icterus.    Conjunctiva/sclera: Conjunctivae normal.  Cardiovascular:     Rate and Rhythm: Normal rate.  Pulmonary:     Effort: Pulmonary effort is normal.  Chest:     Chest wall: No tenderness.  Abdominal:     Palpations: Abdomen is soft.     Tenderness: There is abdominal tenderness in the right lower quadrant and left lower quadrant. There is no guarding or rebound.     Comments: Gravid  Genitourinary:    Vagina: Normal.   Musculoskeletal:        General: Normal range of motion.     Cervical back: Normal range of motion and neck supple.  Skin:    General: Skin is warm and dry.     Findings: No rash.  Neurological:     Mental Status: She is alert and oriented to person, place, and time.     MAU Course  Procedures  MDM-moderate  DDx includes normal variant, degenerating fibroid, unlikely previable labor.   Orders Placed This Encounter  Procedures   US  MFM OB LIMITED   US  MFM OB Transvaginal   US  MFM OB Follow Up   Urinalysis, Routine w reflex microscopic -Urine, Clean Catch   Discharge patient Discharge disposition: 01-Home or Self Care; Discharge patient date: 11/12/2023   No results found for this or any previous visit (from the past 24 hours).  2:30 PM Patient feeling improved. Pain has reduced. Reviewed results of US . Plan for repeat in 2 weeks with MFM. Patient is currently feeling dizzy and does not have a ride. She drove herself to the hospital.  Discussed with RN and we will try to get her a taxi ride home and she can pick up her car later.   Assessment and Plan   1. Uterine fibroid during pregnancy, antepartum   2. [redacted] weeks gestation of pregnancy   - Follow up with MFM in 2 weeks for cervical length due to increased fluid in endocervical canal on US  today - Recommended appointment with OB provider to discuss pain control  - Recommended 80-100oz water daily to help with hydration    Allergies as of 11/12/2023   No Known Allergies      Medication List     TAKE these medications    acetaminophen  650 MG CR tablet Commonly known as: TYLENOL  Take 1 tablet (650 mg total) by mouth every 8 (eight) hours as needed for pain.   ferrous sulfate  325 (65 FE) MG EC tablet TAKE 1 TABLET (325 MG TOTAL) BY MOUTH EVERY MONDAY, WEDNESDAY, AND FRIDAY   prenatal multivitamin Tabs tablet Take 1 tablet by mouth daily at 12 noon.   traMADol  50 MG tablet Commonly known as: ULTRAM  Take 50 mg by  mouth every 6 (six) hours as needed.   triprolidine -pseudoephedrine 2.5-60 MG Tabs tablet Commonly known as: APRODINE Take 1 tablet by mouth every 6 (six) hours as needed for allergies.         [facility] 11/12/2023, 2:35 PM

## 2023-11-13 ENCOUNTER — Inpatient Hospital Stay (HOSPITAL_COMMUNITY)
Admission: AD | Admit: 2023-11-13 | Discharge: 2023-11-16 | DRG: 807 | Disposition: A | Discharge status: Home / Self Care | Payer: Self-pay | Attending: Obstetrics and Gynecology | Admitting: Obstetrics and Gynecology

## 2023-11-13 ENCOUNTER — Encounter (HOSPITAL_COMMUNITY): Payer: Self-pay | Admitting: Obstetrics & Gynecology

## 2023-11-13 ENCOUNTER — Inpatient Hospital Stay (HOSPITAL_COMMUNITY)

## 2023-11-13 ENCOUNTER — Other Ambulatory Visit: Payer: Self-pay

## 2023-11-13 DIAGNOSIS — D259 Leiomyoma of uterus, unspecified: Secondary | ICD-10-CM | POA: Diagnosis present

## 2023-11-13 DIAGNOSIS — O99012 Anemia complicating pregnancy, second trimester: Secondary | ICD-10-CM | POA: Diagnosis present

## 2023-11-13 DIAGNOSIS — O42912 Preterm premature rupture of membranes, unspecified as to length of time between rupture and onset of labor, second trimester: Secondary | ICD-10-CM

## 2023-11-13 DIAGNOSIS — O3412 Maternal care for benign tumor of corpus uteri, second trimester: Secondary | ICD-10-CM

## 2023-11-13 DIAGNOSIS — O99212 Obesity complicating pregnancy, second trimester: Secondary | ICD-10-CM

## 2023-11-13 DIAGNOSIS — Z3A16 16 weeks gestation of pregnancy: Secondary | ICD-10-CM

## 2023-11-13 DIAGNOSIS — O4702 False labor before 37 completed weeks of gestation, second trimester: Secondary | ICD-10-CM

## 2023-11-13 DIAGNOSIS — O42919 Preterm premature rupture of membranes, unspecified as to length of time between rupture and onset of labor, unspecified trimester: Principal | ICD-10-CM | POA: Diagnosis present

## 2023-11-13 DIAGNOSIS — O321XX0 Maternal care for breech presentation, not applicable or unspecified: Secondary | ICD-10-CM | POA: Diagnosis present

## 2023-11-13 DIAGNOSIS — D509 Iron deficiency anemia, unspecified: Secondary | ICD-10-CM | POA: Diagnosis present

## 2023-11-13 DIAGNOSIS — E669 Obesity, unspecified: Secondary | ICD-10-CM

## 2023-11-13 DIAGNOSIS — Z8759 Personal history of other complications of pregnancy, childbirth and the puerperium: Secondary | ICD-10-CM

## 2023-11-13 DIAGNOSIS — O021 Missed abortion: Principal | ICD-10-CM | POA: Diagnosis present

## 2023-11-13 DIAGNOSIS — Z3A17 17 weeks gestation of pregnancy: Secondary | ICD-10-CM

## 2023-11-13 HISTORY — DX: Personal history of other complications of pregnancy, childbirth and the puerperium: Z87.59

## 2023-11-13 HISTORY — DX: Other specified health status: Z78.9

## 2023-11-13 LAB — CBC
HCT: 30.5 % — ABNORMAL LOW (ref 36.0–46.0)
Hemoglobin: 9.3 g/dL — ABNORMAL LOW (ref 12.0–15.0)
MCH: 21.9 pg — ABNORMAL LOW (ref 26.0–34.0)
MCHC: 30.5 g/dL (ref 30.0–36.0)
MCV: 71.9 fL — ABNORMAL LOW (ref 80.0–100.0)
Platelets: 239 10*3/uL (ref 150–400)
RBC: 4.24 MIL/uL (ref 3.87–5.11)
RDW: 16.5 % — ABNORMAL HIGH (ref 11.5–15.5)
WBC: 7 10*3/uL (ref 4.0–10.5)
nRBC: 0 % (ref 0.0–0.2)

## 2023-11-13 LAB — COMPREHENSIVE METABOLIC PANEL WITH GFR
ALT: 33 U/L (ref 0–44)
AST: 28 U/L (ref 15–41)
Albumin: 2.8 g/dL — ABNORMAL LOW (ref 3.5–5.0)
Alkaline Phosphatase: 50 U/L (ref 38–126)
Anion gap: 10 (ref 5–15)
BUN: 8 mg/dL (ref 6–20)
CO2: 20 mmol/L — ABNORMAL LOW (ref 22–32)
Calcium: 9.5 mg/dL (ref 8.9–10.3)
Chloride: 105 mmol/L (ref 98–111)
Creatinine, Ser: 0.58 mg/dL (ref 0.44–1.00)
GFR, Estimated: >60 mL/min (ref >60)
Glucose, Bld: 86 mg/dL (ref 70–99)
Potassium: 3.6 mmol/L (ref 3.5–5.1)
Sodium: 135 mmol/L (ref 135–145)
Total Bilirubin: 0.6 mg/dL (ref 0.0–1.2)
Total Protein: 6.4 g/dL — ABNORMAL LOW (ref 6.5–8.1)

## 2023-11-13 LAB — RUPTURE OF MEMBRANE (ROM)PLUS: Rom Plus: POSITIVE

## 2023-11-13 LAB — TYPE AND SCREEN
ABO/RH(D): A POS
Antibody Screen: NEGATIVE

## 2023-11-13 LAB — POCT FERN TEST: POCT Fern Test: POSITIVE

## 2023-11-13 MED ORDER — FENTANYL CITRATE (PF) 100 MCG/2ML IJ SOLN
100.0000 ug | Freq: Once | INTRAMUSCULAR | Status: AC
  Administered 2023-11-13: 100 ug via INTRAVENOUS
  Filled 2023-11-13: qty 2

## 2023-11-13 MED ORDER — FENTANYL CITRATE (PF) 100 MCG/2ML IJ SOLN
50.0000 ug | INTRAMUSCULAR | Status: DC | PRN
  Filled 2023-11-13: qty 2

## 2023-11-13 MED ORDER — IBUPROFEN 600 MG PO TABS
600.0000 mg | ORAL_TABLET | Freq: Once | ORAL | Status: AC
  Administered 2023-11-13: 600 mg via ORAL
  Filled 2023-11-13: qty 1

## 2023-11-13 MED ORDER — ACETAMINOPHEN 325 MG PO TABS
650.0000 mg | ORAL_TABLET | ORAL | Status: DC | PRN
  Administered 2023-11-14 – 2023-11-15 (×3): 650 mg via ORAL
  Filled 2023-11-13 (×3): qty 2

## 2023-11-13 MED ORDER — SODIUM CHLORIDE 0.9% FLUSH
3.0000 mL | Freq: Two times a day (BID) | INTRAVENOUS | Status: DC
  Administered 2023-11-14 – 2023-11-15 (×3): 10 mL via INTRAVENOUS

## 2023-11-13 MED ORDER — SODIUM CHLORIDE 0.9% FLUSH: 3.0000 mL | INTRAVENOUS | Status: DC | PRN

## 2023-11-13 MED ORDER — HYDROMORPHONE HCL 1 MG/ML IJ SOLN: 1.0000 mg | Freq: Once | INTRAMUSCULAR | Status: DC

## 2023-11-13 NOTE — H&P (Signed)
 Nicole Benton is a 29 y.o. female G1P0 @ 16.6 (EDD: 04/23/24 by 6 wk US ) presenting for leakage of fluid.  Patient reports lower abdominal and back pain over the past 5 days which has been steadily increasing. Large gush of clear fluid at 1845. ROM+, fern, and pooling positive in MAU. US  confirmed anhydramnios.   Pain has now intensified since ROM, reporting contractions q5 minutes  Pregnancy is complicated by:  - Uterine fibroids: fundal fibroid measuring 6.7x4.8x5.2cm.  OB History     Gravida  1   Para  0   Term  0   Preterm  0   AB  0   Living  0      SAB  0   IAB  0   Ectopic  0   Multiple  0   Live Births  0          Past Medical History:  Diagnosis Date   Medical history non-contributory    Syncope and collapse    History reviewed. No pertinent surgical history. Family History: family history includes Diabetes in her maternal grandmother and paternal grandmother; Heart disease in her father; Hypertension in her father and mother; Kidney disease in her father. Social History:  reports that she has never smoked. She has never used smokeless tobacco. She reports that she does not drink alcohol and does not use drugs.     Genetic Screening: Normal Maternal Ultrasounds/Referrals: Normal Fetal Ultrasounds or other Referrals:  None Maternal Substance Abuse:  No Significant Maternal Medications:  Meds include: Other: ASA Significant Maternal Lab Results:  None Number of Prenatal Visits:Less than or equal to 3 verified prenatal visits Maternal Vaccinations:None  Review of Systems  Constitutional:  Negative for chills and fever.  Respiratory:  Negative for chest tightness and shortness of breath.   Cardiovascular:  Negative for chest pain.  Gastrointestinal:  Positive for abdominal pain.  Genitourinary:  Positive for pelvic pain and vaginal discharge. Negative for vaginal bleeding.   History Dilation: Closed Exam by:: Yanessa Hocevar, DO Blood pressure  125/60, pulse 88, temperature 98 F (36.7 C), temperature source Oral, resp. rate 18, height 5\' 6"  (1.676 m), weight 91.4 kg, last menstrual period 07/20/2023, SpO2 100%. Maternal Exam:  Introitus: Normal vulva. Vagina is positive for vaginal discharge (small pooling of clear fluid).  Cervix: Cervix evaluated by sterile speculum exam and digital exam.     Physical Exam Constitutional:      General: She is not in acute distress.    Appearance: She is obese.  HENT:     Head: Normocephalic and atraumatic.  Pulmonary:     Effort: Pulmonary effort is normal.  Genitourinary:    General: Normal vulva.     Vagina: Vaginal discharge (small pooling of clear fluid) present.  Musculoskeletal:        General: Normal range of motion.  Skin:    General: Skin is warm and dry.  Psychiatric:        Mood and Affect: Affect is tearful.        Behavior: Behavior normal.     Prenatal labs: ABO, Rh: --/--/A POS (02/15 1521) Antibody:  Negative Rubella:  Immune RPR:   Non-reactive HBsAg:   Negative HIV:   Negative  GBS:     Assessment/Plan: 28 yo G1P0 @ 16.6 with PPPROM - PPPROM: Cervix is closed, no signs of chorioamnionitis. Pain concerning for developing PTL. Will admit and observe for possible delivery. If stable in AM and undelivered will move forward  with MFM consultation.   Leanne Pronto 11/13/2023, 10:31 PM

## 2023-11-13 NOTE — MAU Note (Signed)
..  Nicole Benton is a 28 y.o. at [redacted]w[redacted]d here in MAU reporting: an hour ago had a gush of clear fluid, is not leaking much any more. Reports back pain and lower abdominal pain since Saturday, and was here yesterday for it. Took medication that was given for pain did not help much.  Denies vaginal bleeding.    Pain score: 9/10 Vitals:   11/13/23 1947  BP: 118/73  Pulse: 94  Resp: 18  Temp: 98 F (36.7 C)  SpO2: 100%     FHT:145 Lab orders placed from triage:  UA

## 2023-11-13 NOTE — MAU Provider Note (Signed)
 History     CSN: [id number]  Arrival date and time: 11/13/23 1928   Event Date/Time   First Provider Initiated Contact with Patient 11/13/23 2015      Chief Complaint  Patient presents with   Rupture of Membranes   HPI [name] is a 28 y.o. year old G68P0000 female at [redacted]w[redacted]d weeks gestation who presents to MAU reporting a gush of clear fluid at 1845. She states she's "not leaking that much now." She reports increased back and lower abdominal pain since Saturday (11/09/2023). She was seen in MAU yesterday for those same symptoms. She rates the pain 9/10. She has taken Percocet for the pain without relief. She denies VB. She receives [facility] with [facility]. Her sister is present and contributing to the history taking.    OB History     Gravida  1   Para  0   Term  0   Preterm  0   AB  0   Living  0      SAB  0   IAB  0   Ectopic  0   Multiple  0   Live Births  0           Past Medical History:  Diagnosis Date   Medical history non-contributory    Syncope and collapse     History reviewed. No pertinent surgical history.  Family History  Problem Relation Age of Onset   Diabetes Paternal Grandmother    Diabetes Maternal Grandmother    Heart disease Father    Hypertension Father    Kidney disease Father    Hypertension Mother     Social History   Tobacco Use   Smoking status: Never   Smokeless tobacco: Never  Vaping Use   Vaping status: Never Used  Substance Use Topics   Alcohol use: No   Drug use: No    Allergies: No Known Allergies  Medications Prior to Admission  Medication Sig Dispense Refill Last Dose/Taking   aspirin 81 MG chewable tablet Chew by mouth daily.   Taking   oxyCODONE-acetaminophen  (PERCOCET/ROXICET) 5-325 MG tablet Take by mouth every 4 (four) hours as needed for severe pain (pain score 7-10).   11/13/2023 at 12:00 PM   Prenatal Vit-Fe Fumarate-FA (PRENATAL MULTIVITAMIN) TABS tablet Take 1 tablet by  mouth daily at 12 noon.   11/13/2023 at  4:00 PM   pyridOXINE (B-6) 50 MG tablet Take 50 mg by mouth daily.   Taking   acetaminophen  (TYLENOL ) 650 MG CR tablet Take 1 tablet (650 mg total) by mouth every 8 (eight) hours as needed for pain.      ferrous sulfate  325 (65 FE) MG EC tablet TAKE 1 TABLET (325 MG TOTAL) BY MOUTH EVERY MONDAY, WEDNESDAY, AND FRIDAY 60 tablet 0    traMADol  (ULTRAM ) 50 MG tablet Take 50 mg by mouth every 6 (six) hours as needed.      triprolidine -pseudoephedrine (APRODINE) 2.5-60 MG TABS tablet Take 1 tablet by mouth every 6 (six) hours as needed for allergies. 56 tablet 1     Review of Systems  Constitutional: Negative.   HENT: Negative.    Eyes: Negative.   Respiratory: Negative.    Cardiovascular: Negative.   Gastrointestinal: Negative.   Endocrine: Negative.   Genitourinary:  Positive for pelvic pain (since Saturday; unrelieved with Percocet) and vaginal discharge (gush of clear fluid at 1845).  Musculoskeletal: Negative.   Skin: Negative.   Allergic/Immunologic: Negative.   Neurological:  Negative.   Hematological: Negative.   Psychiatric/Behavioral: Negative.     Physical Exam   Blood pressure 125/60, pulse 88, temperature 98 F (36.7 C), temperature source Oral, resp. rate 18, height 5\' 6"  (1.676 m), weight 91.4 kg, last menstrual period 07/20/2023, SpO2 100%.  Physical Exam Vitals and nursing note reviewed. Exam conducted with a chaperone present.  Constitutional:      Appearance: Normal appearance. She is normal weight.  Cardiovascular:     Rate and Rhythm: Normal rate.  Pulmonary:     Effort: Pulmonary effort is normal.  Abdominal:     Palpations: Abdomen is soft.  Genitourinary:    General: Normal vulva.     Comments: Pelvic exam: External genitalia normal, SE: vaginal walls pink and well rugated, cervix is smooth, pink, no lesions, moderate amt of clear, yellow-tinged fluid pooled in vaginal vault -- fern slide (obtained from fluid trickling  from introitus) and ROM+ done, cervix visually dilated ~ 1 cm and appears to be soft consistency. Cervical exam deferred.  Musculoskeletal:        General: Normal range of motion.  Skin:    General: Skin is warm and dry.  Neurological:     Mental Status: She is alert and oriented to person, place, and time.  Psychiatric:        Mood and Affect: Mood normal.        Behavior: Behavior normal.        Thought Content: Thought content normal.        Judgment: Judgment normal.    FHTs by doppler: 145 bpm   Reassessment @ 2128: Patient reports no relief in pain with Ibuprofen . Informed of results of U/S and orders for saline lock and IV pain medications. Patient verbalized an understanding of the plan of care and agrees.   MAU Course  Procedures  MDM CCUA CBC CMP Fern Slide x 2 -- first slide obtained by RN was negative ROM+ Ibuprofen  600 mg po  Saline Lock Fentanyl 100 mg IVP  Results for orders placed or performed during the hospital encounter of 11/13/23 (from the past 24 hours)  POCT fern test     Status: Normal   Collection Time: 11/13/23  8:12 PM  Result Value Ref Range   POCT Fern Test Positive = ruptured amniotic membanes   Rupture of Membrane (ROM) Plus     Status: None   Collection Time: 11/13/23  8:26 PM  Result Value Ref Range   Rom Plus POSITIVE   CBC     Status: Abnormal   Collection Time: 11/13/23  8:41 PM  Result Value Ref Range   WBC 7.0 4.0 - 10.5 K/uL   RBC 4.24 3.87 - 5.11 MIL/uL   Hemoglobin 9.3 (L) 12.0 - 15.0 g/dL   HCT 04.5 (L) 40.9 - 81.1 %   MCV 71.9 (L) 80.0 - 100.0 fL   MCH 21.9 (L) 26.0 - 34.0 pg   MCHC 30.5 30.0 - 36.0 g/dL   RDW 91.4 (H) 78.2 - 95.6 %   Platelets 239 150 - 400 K/uL   nRBC 0.0 0.0 - 0.2 %  Comprehensive metabolic panel with GFR     Status: Abnormal   Collection Time: 11/13/23  8:41 PM  Result Value Ref Range   Sodium 135 135 - 145 mmol/L   Potassium 3.6 3.5 - 5.1 mmol/L   Chloride 105 98 - 111 mmol/L   CO2 20 (L) 22 -  32 mmol/L   Glucose, Bld 86 70 -  99 mg/dL   BUN 8 6 - 20 mg/dL   Creatinine, Ser 6.57 0.44 - 1.00 mg/dL   Calcium 9.5 8.9 - 84.6 mg/dL   Total Protein 6.4 (L) 6.5 - 8.1 g/dL   Albumin 2.8 (L) 3.5 - 5.0 g/dL   AST 28 15 - 41 U/L   ALT 33 0 - 44 U/L   Alkaline Phosphatase 50 38 - 126 U/L   Total Bilirubin 0.6 0.0 - 1.2 mg/dL   GFR, Estimated >96 >29 mL/min   Anion gap 10 5 - 15   *Consult with Dr. Rossie Coon @ 2124 - notified of patient's complaints, assessments, lab & preliminary U/S results.  Dr. Felipe Horton is coming in to see the patient  2145: Dr. Felipe Horton at bedside speaking to patient.  Assessment and Plan  1. Preterm premature rupture of membranes (PPROM) with unknown onset of labor (Primary)  2. [redacted] weeks gestation of pregnancy - Admit to [facility] - Dr. Felipe Horton assumes care of patient @ the time of admission - See Dr. Shirlie Dove H&P documentation  Almond Army, CNM 11/13/2023, 8:37 PM

## 2023-11-14 DIAGNOSIS — O4102X0 Oligohydramnios, second trimester, not applicable or unspecified: Secondary | ICD-10-CM

## 2023-11-14 DIAGNOSIS — Z3A16 16 weeks gestation of pregnancy: Secondary | ICD-10-CM | POA: Diagnosis not present

## 2023-11-14 DIAGNOSIS — Z3A17 17 weeks gestation of pregnancy: Secondary | ICD-10-CM

## 2023-11-14 DIAGNOSIS — O99012 Anemia complicating pregnancy, second trimester: Secondary | ICD-10-CM | POA: Diagnosis present

## 2023-11-14 DIAGNOSIS — D259 Leiomyoma of uterus, unspecified: Secondary | ICD-10-CM

## 2023-11-14 DIAGNOSIS — O42912 Preterm premature rupture of membranes, unspecified as to length of time between rupture and onset of labor, second trimester: Secondary | ICD-10-CM | POA: Diagnosis present

## 2023-11-14 DIAGNOSIS — O3412 Maternal care for benign tumor of corpus uteri, second trimester: Secondary | ICD-10-CM

## 2023-11-14 DIAGNOSIS — D509 Iron deficiency anemia, unspecified: Secondary | ICD-10-CM | POA: Diagnosis present

## 2023-11-14 DIAGNOSIS — O021 Missed abortion: Secondary | ICD-10-CM | POA: Diagnosis present

## 2023-11-14 DIAGNOSIS — O321XX0 Maternal care for breech presentation, not applicable or unspecified: Secondary | ICD-10-CM | POA: Diagnosis present

## 2023-11-14 NOTE — Progress Notes (Signed)
 Patient ID: Nicole Benton, female   DOB: 1996-02-27, 28 y.o.   MRN: [id number]  CTSP re: inability to auscultate fetal heart rate.  Bedside ultrasound performed which shows fetus in the breech presentation with anhydramnios.  Fetal heart rate 136.  Patient is status post MFM consult.  She will consider her options for management and decide how she wishes to proceed in the morning.  Advised patient will discontinue external toco.  Discussed with patient that if she were to labor on her own we would not attempt to stop labor.  If the patient develops any signs or symptoms of infection would proceed with indicated delivery.  Patient feels irregular cramping but no regular contractions.    Assessment and plan: 17-week P PROM Patient will continue observation overnight.  Will make disposition decision tomorrow.

## 2023-11-14 NOTE — Progress Notes (Signed)
 Patient ID: Nicole Benton, female   DOB: [birth year], 28 y.o.   MRN: [id number]  Hospital day #26 28 year old G1, P0 at 17+0 with previable premature rupture membranes  S: Patient's pain has resolved since admission.  She is not currently leaking fluid.  Otherwise without complaint.  Did have slight spotting when wiping this morning after using the bathroom.  O:  Vitals:   11/13/23 1947 11/13/23 2005 11/13/23 2314 11/14/23 0750  BP: 118/73 125/60 (!) 133/57 (!) 127/55  Pulse: 94 88 84 91  Resp: 18  17 16   Temp: 98 F (36.7 C)  98.4 F (36.9 C) 98.3 F (36.8 C)  TempSrc: Oral  Oral Oral  SpO2: 100%  100% 100%  Weight: 91.4 kg     Height: 5\' 6"  (1.676 m)      Oriented x 3, no apparent distress Normal work of breathing Abdomen soft Fetal heart rate 159  US  MFM OB LIMITED 11/12/2023  Narrative ---------------------------------------------------------------------- OBSTETRICS REPORT                       (Signed Final 11/12/2023 02:23 pm) ---------------------------------------------------------------------- Patient Info  ID #:       [id number]                          D.O.B.:  1996-04-16 (28 yrs)(F) Name:       Nicole Benton                 Visit Date: 11/12/2023 01:04 pm COMMEY ---------------------------------------------------------------------- Performed By  Attending:        Sal Crass MD         Referred By:      [facility] MAU/Triage Performed By:     Drexel Gentles          [facility] ---------------------------------------------------------------------- Orders  #  Description                           Code        Ordered By 1  US  MFM OB LIMITED                     78295.62    KIMBERLY NEWTON 2  US  MFM OB TRANSVAGINAL                13086.5     KIMBERLY NEWTON ----------------------------------------------------------------------  #  Order #                     Accession #                 Episode # 1  [id number]                   [id number]                 [id number] 2  [id number]                   [id number]                 [id number] ---------------------------------------------------------------------- Indications  Uterine fibroids  O34.10 Abdominal pain in pregnancy                    O99.89 Obesity complicating pregnancy, second         O99.212 trimester Encounter for antenatal screening,             Z36.9 unspecified [redacted] weeks gestation of pregnancy                Z3A.16 ---------------------------------------------------------------------- Fetal Evaluation  Num Of Fetuses:         1 Fetal Heart Rate(bpm):  146 Cardiac Activity:       Observed Presentation:           Cephalic Placenta:               Posterior P. Cord Insertion:      Visualized  Amniotic Fluid AFI FV:      Within normal limits  Largest Pocket(cm) 6 ---------------------------------------------------------------------- OB History  Gravidity:    1         Term:   0        Prem:   0        SAB:   0 TOP:          0       Ectopic:  0        Living: 0 ---------------------------------------------------------------------- Gestational Age  LMP:           16w 3d        Date:  07/20/23                   EDD:   04/25/24 Clinical EDD:  16w 6d                                        EDD:   04/22/24 Best:          16w 6d     Det. By:  Clinical EDD             EDD:   04/22/24 ---------------------------------------------------------------------- Anatomy  Cranium:               Appears normal         Stomach:                Appears normal, left sided Choroid Plexus:        Appears normal         Kidneys:                Appear normal Thoracic:              Appears normal         Bladder:                Appears normal Diaphragm:             Appears normal ---------------------------------------------------------------------- Cervix Uterus Adnexa  Cervix Length:            2.8   cm. Measured transvaginally  Uterus Single fibroid noted, see table below.  Right Ovary Within normal limits.  Left Ovary Within normal limits.  Cul De Sac No free fluid seen.  Adnexa No abnormality visualized ---------------------------------------------------------------------- Myomas  Site                     L(cm)      W(cm)  D(cm)       Location Fundal                   6.7        5.7        7 ----------------------------------------------------------------------  Blood Flow                  RI       PI       Comments  ---------------------------------------------------------------------- Comments  This patient presented to the MAU due to pain related to her fibroids.  This is her first pregnancy.  A limited ultrasound performed today shows that the fetus is in the vertex presentation.  There was normal amniotic fluid noted.  A large 6 to 7 cm fundal fibroid was noted on today's exam.  A transvaginal ultrasound performed today shows a cervical length of 2.8 cm long.  There were no signs of cervical funneling noted today.  However, fluid was noted within the cervical canal.  Due to her borderline shortened cervix, she should be referred to the MFM office for a fetal anatomy scan and cervical length measurement in 2 weeks. ---------------------------------------------------------------------- Sal Crass, MD Electronically Signed Final Report   11/12/2023 02:23 pm ---------------------------------------------------------------------- Ultrasound from 4/30/2 025 from admission does not have a formal report however it appears to show a viable fetus in the breech presentation with anhydramnios.  Results for orders placed or performed during the hospital encounter of 11/13/23 (from the past 24 hours)  POCT fern test     Status: Normal   Collection Time: 11/13/23  8:12 PM  Result Value Ref Range   POCT Fern Test Positive = ruptured amniotic membanes   Rupture  of Membrane (ROM) Plus     Status: None   Collection Time: 11/13/23  8:26 PM  Result Value Ref Range   Rom Plus POSITIVE   CBC     Status: Abnormal   Collection Time: 11/13/23  8:41 PM  Result Value Ref Range   WBC 7.0 4.0 - 10.5 K/uL   RBC 4.24 3.87 - 5.11 MIL/uL   Hemoglobin 9.3 (L) 12.0 - 15.0 g/dL   HCT 16.1 (L) 09.6 - 04.5 %   MCV 71.9 (L) 80.0 - 100.0 fL   MCH 21.9 (L) 26.0 - 34.0 pg   MCHC 30.5 30.0 - 36.0 g/dL   RDW 40.9 (H) 81.1 - 91.4 %   Platelets 239 150 - 400 K/uL   nRBC 0.0 0.0 - 0.2 %  Comprehensive metabolic panel with GFR     Status: Abnormal   Collection Time: 11/13/23  8:41 PM  Result Value Ref Range   Sodium 135 135 - 145 mmol/L   Potassium 3.6 3.5 - 5.1 mmol/L   Chloride 105 98 - 111 mmol/L   CO2 20 (L) 22 - 32 mmol/L   Glucose, Bld 86 70 - 99 mg/dL   BUN 8 6 - 20 mg/dL   Creatinine, Ser 7.82 0.44 - 1.00 mg/dL   Calcium 9.5 8.9 - 95.6 mg/dL   Total Protein 6.4 (L) 6.5 - 8.1 g/dL   Albumin 2.8 (L) 3.5 - 5.0 g/dL   AST 28 15 - 41 U/L   ALT 33 0 - 44 U/L   Alkaline Phosphatase 50 38 - 126 U/L   Total Bilirubin 0.6 0.0 - 1.2 mg/dL   GFR, Estimated >21 >30 mL/min   Anion gap 10 5 - 15  Type and screen [facility]  Status: None   Collection Time: 11/13/23 10:18 PM  Result Value Ref Range   ABO/RH(D) A POS    Antibody Screen NEG    Sample Expiration      11/16/2023,2359 Performed at [facility], [address] [place], [place] [address]    Assessment and plan: 28 year old G1, P0 at 17+0 with previable premature  PROM 1) on admission the patient was experiencing a significant amount of pain and there was concern for eminent delivery.  Since then her pain has resolved. 2) frank discussion with the patient regarding risks/benefits/alternatives of expectant management versus pregnancy termination.  Discussed the poor prognosis for pregnancy with premature rupture of membranes at this gestational age.  Discussed maternal risks of  continuing with the pregnancy including but not limited to infection, hemorrhage, death.  Discussed pregnancy termination and [place]  72-hour termination consent procedure. 3) patient desires maternal-fetal medicine consult.

## 2023-11-14 NOTE — Consult Note (Signed)
 MFM Consult Note  Nicole Benton is a gravida 1 para 0 currently at 17 weeks and 0 days.  She was admitted last night due to abdominal pain and leakage of fluid.   She has a known fibroid uterus.  The patient reports that the lower abdominal pain started about 5 days ago.  She was seen in the MAU 2 days ago during which time an ultrasound showed that there was normal amniotic fluid around the fetus.  A 6 to 7 cm fundal fibroid was noted.  A transvaginal ultrasound performed at that time showed a cervical length of 2.8 cm long without any signs of funneling.  There was also fluid noted within the cervical canal.  Plans were then made for the patient to be seen in the MFM office for a cervical length measurement within the next 2 weeks.  The patient presented back to the hospital last night complaining of feeling leakage of a large gush of fluid.  She had significant abdominal pain at the time of admission.  An exam in the MAU confirmed that she ruptured membranes with ferning and pooling positive tests.  An ultrasound performed last night showed anhydramnios.  This was a significant change as compared to her ultrasound 2 days ago when there was normal amniotic fluid note,  confirming PPROM.  Currently, the patient reports that her abdominal pain has resolved.  She is not leaking much fluid.  She denies any signs or symptoms of an intrauterine infection.  The implications and management of PPROM at 17 weeks was discussed with the patient and her younger sister.    They were advised regarding the poor prognosis for pregnancies that have ruptured membranes at such an early gestational age due to the increased risk of fetal pulmonary hypoplasia, an increased risk of an intrauterine infection, an increased risk of spontaneous labor, and an increased risk of IUFD associated with anhydramnios.  They were advised that in certain rare cases, the membranes may seal and the amniotic fluid may build back  up.  If this were to happen, the prognosis would be improved.  However, they were advised that most cases of PPROM at such an early gestational age will most likely end in a miscarriage/IUFD or an indicated previable delivery.  They understand that viability is currently defined at between 23 to 24 weeks.  Delivery prior to this gestational age will most likely result in a nonviable fetus.  They were also advised that the fetal lung tissue require amniotic fluid to distend the air sacs to allow them to grow.  Anhydramnios at her current gestational age is usually associated with fetal pulmonary hypoplasia.  There is a likelihood that even if she were to make it to viability or later, that the fetal lung tissue may be inadequate for long-term survival.  As we are roughly 6 weeks away from viability, they were advised that a lot can happen between now and that time.  She may develop an intrauterine infection, go into spontaneous labor, or have a fetal demise.  The option of proceeding with an induction of labor now to decrease the risk of maternal infection, sepsis, and death was also discussed.  The patient and her sister stated that they will think about everything that was discussed with them today and will make a decision regarding what they would like to do next tomorrow.  It is reasonable to keep her in the hospital until tomorrow for observation.  I will perform another ultrasound exam  tomorrow afternoon to check the fetal heart rate and amniotic fluid levels.  Should anhydramnios continue to be noted and a fetal heartbeat is still present, I will see the patient next week in the office for another ultrasound exam.  They understand that should anhydramnios continue to be noted after a week or two of observation, that the prognosis for her pregnancy will be poor.  Should she remain undelivered at 23 weeks, she can then be readmitted to the hospital for observation with daily fetal testing  with delivery at 34 weeks.  A course of antenatal corticosteroids should be given when she is readmitted.  At her early gestational age, it is questionable if latency antibiotics will be helpful in improving her obstetrical outcome.    The patient and her sister stated that all of their questions have been answered.

## 2023-11-15 ENCOUNTER — Inpatient Hospital Stay (HOSPITAL_COMMUNITY)

## 2023-11-15 ENCOUNTER — Encounter (HOSPITAL_COMMUNITY): Payer: Self-pay | Admitting: Student

## 2023-11-15 DIAGNOSIS — O021 Missed abortion: Secondary | ICD-10-CM | POA: Diagnosis not present

## 2023-11-15 DIAGNOSIS — Z3A17 17 weeks gestation of pregnancy: Secondary | ICD-10-CM

## 2023-11-15 MED ORDER — SIMETHICONE 80 MG PO CHEW: 80.0000 mg | CHEWABLE_TABLET | ORAL | Status: DC | PRN

## 2023-11-15 MED ORDER — OXYCODONE HCL 5 MG PO TABS: 10.0000 mg | ORAL_TABLET | ORAL | Status: DC | PRN

## 2023-11-15 MED ORDER — DIBUCAINE (PERIANAL) 1 % EX OINT: 1.0000 | TOPICAL_OINTMENT | CUTANEOUS | Status: DC | PRN

## 2023-11-15 MED ORDER — ACETAMINOPHEN 325 MG PO TABS: 650.0000 mg | ORAL_TABLET | ORAL | Status: DC | PRN

## 2023-11-15 MED ORDER — BENZOCAINE-MENTHOL 20-0.5 % EX AERO: 1.0000 | INHALATION_SPRAY | CUTANEOUS | Status: DC | PRN

## 2023-11-15 MED ORDER — IBUPROFEN 600 MG PO TABS
600.0000 mg | ORAL_TABLET | Freq: Four times a day (QID) | ORAL | Status: DC
  Filled 2023-11-15: qty 1

## 2023-11-15 MED ORDER — ONDANSETRON HCL 4 MG/2ML IJ SOLN: 4.0000 mg | INTRAMUSCULAR | Status: DC | PRN

## 2023-11-15 MED ORDER — MISOPROSTOL 200 MCG PO TABS
400.0000 ug | ORAL_TABLET | ORAL | Status: DC
  Administered 2023-11-15 (×2): 400 ug via VAGINAL
  Filled 2023-11-15 (×2): qty 2

## 2023-11-15 MED ORDER — COCONUT OIL OIL: 1.0000 | TOPICAL_OIL | Status: DC | PRN

## 2023-11-15 MED ORDER — ONDANSETRON HCL 4 MG PO TABS: 4.0000 mg | ORAL_TABLET | ORAL | Status: DC | PRN

## 2023-11-15 MED ORDER — KETOROLAC TROMETHAMINE 30 MG/ML IJ SOLN
INTRAMUSCULAR | Status: AC
  Filled 2023-11-15: qty 1

## 2023-11-15 MED ORDER — FENTANYL CITRATE (PF) 100 MCG/2ML IJ SOLN: 100.0000 ug | INTRAMUSCULAR | Status: DC | PRN

## 2023-11-15 MED ORDER — DIPHENHYDRAMINE HCL 25 MG PO CAPS: 25.0000 mg | ORAL_CAPSULE | Freq: Four times a day (QID) | ORAL | Status: DC | PRN

## 2023-11-15 MED ORDER — SENNOSIDES-DOCUSATE SODIUM 8.6-50 MG PO TABS: 2.0000 | ORAL_TABLET | ORAL | Status: DC

## 2023-11-15 MED ORDER — OXYCODONE HCL 5 MG PO TABS: 5.0000 mg | ORAL_TABLET | ORAL | Status: DC | PRN

## 2023-11-15 MED ORDER — WITCH HAZEL-GLYCERIN EX PADS: 1.0000 | MEDICATED_PAD | CUTANEOUS | Status: DC | PRN

## 2023-11-15 MED ORDER — KETOROLAC TROMETHAMINE 30 MG/ML IJ SOLN
30.0000 mg | Freq: Once | INTRAMUSCULAR | Status: AC
  Administered 2023-11-15: 30 mg via INTRAVENOUS

## 2023-11-15 NOTE — Plan of Care (Signed)
  Problem: Education: Goal: Knowledge of disease or condition will improve Outcome: Progressing Goal: Knowledge of the prescribed therapeutic regimen will improve Outcome: Progressing Goal: Individualized Educational Video(s) Outcome: Progressing   Problem: Clinical Measurements: Goal: Complications related to the disease process, condition or treatment will be avoided or minimized Outcome: Progressing   Problem: Education: Goal: Knowledge of General Education information will improve Description: Including pain rating scale, medication(s)/side effects and non-pharmacologic comfort measures Outcome: Progressing   Problem: Health Behavior/Discharge Planning: Goal: Ability to manage health-related needs will improve Outcome: Progressing   Problem: Clinical Measurements: Goal: Ability to maintain clinical measurements within normal limits will improve Outcome: Progressing Goal: Will remain free from infection Outcome: Progressing Goal: Diagnostic test results will improve Outcome: Progressing Goal: Respiratory complications will improve Outcome: Progressing Goal: Cardiovascular complication will be avoided Outcome: Progressing   Problem: Activity: Goal: Risk for activity intolerance will decrease Outcome: Progressing   Problem: Nutrition: Goal: Adequate nutrition will be maintained Outcome: Progressing   Problem: Coping: Goal: Level of anxiety will decrease Outcome: Progressing   Problem: Elimination: Goal: Will not experience complications related to bowel motility Outcome: Progressing Goal: Will not experience complications related to urinary retention Outcome: Progressing   Problem: Pain Managment: Goal: General experience of comfort will improve and/or be controlled Outcome: Progressing   Problem: Safety: Goal: Ability to remain free from injury will improve Outcome: Progressing   Problem: Skin Integrity: Goal: Risk for impaired skin integrity will  decrease Outcome: Progressing   Problem: Education: Goal: Knowledge of condition will improve Outcome: Progressing Goal: Individualized Educational Video(s) Outcome: Progressing Goal: Individualized Newborn Educational Video(s) Outcome: Progressing   Problem: Activity: Goal: Will verbalize the importance of balancing activity with adequate rest periods Outcome: Progressing Goal: Ability to tolerate increased activity will improve Outcome: Progressing   Problem: Coping: Goal: Ability to identify and utilize available resources and services will improve Outcome: Progressing   Problem: Life Cycle: Goal: Chance of risk for complications during the postpartum period will decrease Outcome: Progressing   Problem: Role Relationship: Goal: Ability to demonstrate positive interaction with newborn will improve Outcome: Progressing   Problem: Skin Integrity: Goal: Demonstration of wound healing without infection will improve Outcome: Progressing

## 2023-11-15 NOTE — Progress Notes (Signed)
 Spoke with patient and sister this AM.  She is feeling overall well. Denies fevers / chills / vaginal bleeding. Some small discharge from vagina.  Mild abdominal pain improved from yesterday.    She reports that she wants to await follow up ultrasound today to make any decisions  BP (!) 128/58   Pulse 89   Temp 98 F (36.7 C) (Oral)   Resp 17   Ht 5\' 6"  (1.676 m)   Wt 91.4 kg   LMP 07/20/2023   SpO2 100%   BMI 32.54 kg/m   NAD Abdomen soft, mild ttp in rlq Pelvic: deferred  A/P: G1 @ 17 weeks with PPROM  PPROM:  Saw MFM yesterday and has been well counseled on poor prognosis at early gestational age and setting of anhydramnios.  Is aware of increased risk of intrauterine infection.  Does have some mild tenderness on exam today.  Vital signs are currently stable.  It is challenging to assess this tenderness as she has experienced significant pelvic pain over the last 4 weeks that was thought to be related to degeneration of her large fundal fibroid.  Did discuss with patient and her sister that this may represent early developing infection which would make expectant management risky.  F/u ultrasound has been ordered and Dr. Grayland Le to see her this afternoon after ultrasound

## 2023-11-15 NOTE — Progress Notes (Signed)
 Notified by RN Edwyna Grams at [phone] that delivery was felt to be imminent.  Upon immediate presentation to the room, patient reported increased pain and pelvic pressure.  Fetal parts were palpated in the upper vagina.  She did not want to push at that time, but did desire pain medication other than fentanyl .  30 mg IV toradol  was administered.   Pressure increased and the fetus was noted to be present at the introitus and delivered without difficulty.  Cord was clamped and cut and the fetus wrapped.  Upon digital exam, the placenta was still in the uterus.  An additional dose of 400 mcg of misoprostol  was administered.  Bleeding was scant.  The fetus was examined and noted to be grossly normal for 17 weeks without obvious anatomic abnormalities.  Upon repeat exam 1 hour later, the placenta was noted to be in the upper vagina.  With a slight push, the placenta delivered in tact.  Patient desires to rest overnight with discharge in the morning.  She is leaning towards hospital disposition of the fetal remains  Addendum:  EBL minimal.  Placenta to pathology

## 2023-11-15 NOTE — Progress Notes (Signed)
 Pt delivered non-viable fetus at 1735.

## 2023-11-15 NOTE — Progress Notes (Addendum)
 Placenta delivered at 1829 by Dr. Fulton Job.  EBL 100 mL per Dr. Fulton Job

## 2023-11-15 NOTE — Progress Notes (Signed)
 Reviewed again findings of fetal demise with Nicole Benton and her sister.  Discussed IOL vs D&C.  She would prefer to proceed with induction of labor now.  Reviewed pain management options in labor. Does not want to see and hold the infant.  Will decide on disposition of remains.  Will plan to send placenta to pathology.    Plan: misoprostol  400 mcg q3hours

## 2023-11-15 NOTE — Consult Note (Signed)
 MFM Consult Note  Nicole Benton is currently at 17 weeks and 1 day.  She has been hospitalized due to PPROM with anhydramnios.  Currently, the patient denies any signs or symptoms of an intrauterine infection.  She remains afebrile.  She had an ultrasound performed this afternoon that showed a fetal demise.  No fetal heartbeat was present on the exam.  The patient was advised that an IUFD is a common outcome following PPROM at such an early gestational age.  Due to the IUFD, the patient was advised that she will require an induction of labor using Cytotec .  The patient is agreeable to undergoing induction later today.  She was reassured that this is an unfortunate event.  Most women who have an unsuccessful pregnancy outcome in one pregnancy will most likely have a successful outcome in their future pregnancies.    The patient stated that all of her questions were answered.

## 2023-11-16 LAB — CBC
HCT: 27.6 % — ABNORMAL LOW (ref 36.0–46.0)
Hemoglobin: 8.4 g/dL — ABNORMAL LOW (ref 12.0–15.0)
MCH: 21.6 pg — ABNORMAL LOW (ref 26.0–34.0)
MCHC: 30.4 g/dL (ref 30.0–36.0)
MCV: 71.1 fL — ABNORMAL LOW (ref 80.0–100.0)
Platelets: 254 10*3/uL (ref 150–400)
RBC: 3.88 MIL/uL (ref 3.87–5.11)
RDW: 16.4 % — ABNORMAL HIGH (ref 11.5–15.5)
WBC: 7.5 10*3/uL (ref 4.0–10.5)
nRBC: 0 % (ref 0.0–0.2)

## 2023-11-16 MED ORDER — ALBUTEROL SULFATE (2.5 MG/3ML) 0.083% IN NEBU: 2.5000 mg | INHALATION_SOLUTION | Freq: Once | RESPIRATORY_TRACT | Status: DC | PRN

## 2023-11-16 MED ORDER — SODIUM CHLORIDE 0.9 % IV BOLUS: 500.0000 mL | Freq: Once | INTRAVENOUS | Status: DC | PRN

## 2023-11-16 MED ORDER — DIPHENHYDRAMINE HCL 50 MG/ML IJ SOLN: 25.0000 mg | Freq: Once | INTRAMUSCULAR | Status: DC | PRN

## 2023-11-16 MED ORDER — SODIUM CHLORIDE 0.9 % IV SOLN
INTRAVENOUS | Status: DC | PRN
  Administered 2023-11-16: 10 mL/h via INTRAVENOUS

## 2023-11-16 MED ORDER — METHYLPREDNISOLONE SODIUM SUCC 125 MG IJ SOLR: 125.0000 mg | Freq: Once | INTRAMUSCULAR | Status: DC | PRN

## 2023-11-16 MED ORDER — IBUPROFEN 600 MG PO TABS: 600.0000 mg | ORAL_TABLET | Freq: Four times a day (QID) | ORAL | Status: DC

## 2023-11-16 MED ORDER — EPINEPHRINE 0.3 MG/0.3ML IJ SOAJ: 0.3000 mg | Freq: Once | INTRAMUSCULAR | Status: DC | PRN

## 2023-11-16 MED ORDER — IRON SUCROSE 500 MG IVPB - SIMPLE MED
500.0000 mg | Freq: Once | INTRAVENOUS | Status: AC
  Administered 2023-11-16: 500 mg via INTRAVENOUS
  Filled 2023-11-16: qty 275
  Filled 2023-11-16: qty 500

## 2023-11-16 NOTE — Progress Notes (Signed)
 Fetal weight: 5.5 oz Fetal length: 8.5 inches

## 2023-11-16 NOTE — Discharge Summary (Signed)
 Postpartum Discharge Summary  Date of Service updated 4/30-11/16/2023     Patient Name: Nicole Benton DOB: [birth year] MRN: [id number]  Date of admission: 11/13/2023 Delivery date: 11/15/2023 Delivering provider: Luan Rumpf, MD Date of discharge: 11/16/2023  Admitting diagnosis: Preterm premature rupture of membranes (PPROM) with unknown onset of labor [O42.919] Intrauterine pregnancy: [redacted]w[redacted]d     Secondary diagnosis:  Principal Problem:   Preterm premature rupture of membranes (PPROM) with unknown onset of labor  Additional problems: IUFD, anemia    Discharge diagnosis: Anemia and previable PPROM, IUFD s/p vaginal delivery                                               Post partum procedures: iron infusion Augmentation: Cytotec  Complications: None  Hospital course: Induction of Labor With Vaginal Delivery   28 y.o. yo G1P0100 at [redacted]w[redacted]d was admitted to the hospital 11/13/2023 for previable PPROM. She was diagnosed on 5/2 with unfortunate IUFD.  Indication for induction:  previable PPROM, IUFD .  Patient had an labor course complicated by nothing Membrane Rupture Time/Date: 4/30, 18:45 Delivery Method: vaginal Lacerations:  none Details of delivery can be found in separate delivery note.  Patient had a postpartum course complicated by symptomatic anemia. Patient is discharged home 11/16/23.    Magnesium Sulfate received: No BMZ received: No Rhophylac:No   Physical exam  Vitals:   11/15/23 1954 11/16/23 0016 11/16/23 0017 11/16/23 0914  BP: (!) 131/56  (!) 116/56 (!) 113/53  Pulse: (!) 107  88 85  Resp: 16  16 17   Temp: 100.1 F (37.8 C) 98.3 F (36.8 C)  97.9 F (36.6 C)  TempSrc: Oral Oral  Oral  SpO2: 100%  100% 100%  Weight:      Height:       General: alert, cooperative, and no distress Lochia: appropriate Uterine Fundus: firm DVT Evaluation: No evidence of DVT seen on physical exam. Labs: Lab Results  Component Value Date   WBC 7.5 11/16/2023    HGB 8.4 (L) 11/16/2023   HCT 27.6 (L) 11/16/2023   MCV 71.1 (L) 11/16/2023   PLT 254 11/16/2023      Latest Ref Rng & Units 11/13/2023    8:41 PM  CMP  Glucose 70 - 99 mg/dL 86   BUN 6 - 20 mg/dL 8   Creatinine 4.09 - 8.11 mg/dL 9.14   Sodium 782 - 956 mmol/L 135   Potassium 3.5 - 5.1 mmol/L 3.6   Chloride 98 - 111 mmol/L 105   CO2 22 - 32 mmol/L 20   Calcium 8.9 - 10.3 mg/dL 9.5   Total Protein 6.5 - 8.1 g/dL 6.4   Total Bilirubin 0.0 - 1.2 mg/dL 0.6   Alkaline Phos 38 - 126 U/L 50   AST 15 - 41 U/L 28   ALT 0 - 44 U/L 33    Edinburgh Score:     No data to display            After visit meds:  Allergies as of 11/16/2023   No Known Allergies      Medication List     STOP taking these medications    aspirin 81 MG chewable tablet   ferrous sulfate  325 (65 FE) MG EC tablet   oxyCODONE -acetaminophen  5-325 MG tablet Commonly known as: PERCOCET/ROXICET   pyridOXINE 50 MG tablet  Commonly known as: B-6   traMADol  50 MG tablet Commonly known as: ULTRAM    triprolidine -pseudoephedrine 2.5-60 MG Tabs tablet Commonly known as: APRODINE       TAKE these medications    acetaminophen  650 MG CR tablet Commonly known as: TYLENOL  Take 1 tablet (650 mg total) by mouth every 8 (eight) hours as needed for pain.   ibuprofen  600 MG tablet Commonly known as: ADVIL  Take 1 tablet (600 mg total) by mouth every 6 (six) hours.   prenatal multivitamin Tabs tablet Take 1 tablet by mouth daily at 12 noon.         Discharge home in stable condition Fetal remains dispo: hospital Discharge instruction: per After Visit Summary and Postpartum booklet. Activity: Advance as tolerated. Pelvic rest for 6 weeks.  Diet: routine diet Anticipated Birth Control: Unsure Postpartum Appointment:2 weeks Additional Postpartum F/U: Postpartum Depression checkup and discussion of future management Future Appointments:No future appointments. Follow up Visit:  Follow-up Information      [name], MD Follow up in 2 week(s).   Specialty: Obstetrics and Gynecology Contact information: [address] [place] [address] [phone]                     11/16/2023 [name], MD

## 2023-11-16 NOTE — Progress Notes (Signed)
 Post Partum Day 0 Subjective: Nicole Benton endorses lightheadedness when she stands and walks but has no other complaints. She denies pain. She is voiding well. Vaginal bleeding appropriate. Tolerating PO. Ok with hospital dispo for baby.   Objective: Blood pressure (!) 113/53, pulse 85, temperature 97.9 F (36.6 C), temperature source Oral, resp. rate 17, height 5\' 6"  (1.676 m), weight 91.4 kg, last menstrual period 07/20/2023, SpO2 100%.  Physical Exam:  General: alert, cooperative, and no distress Lochia: appropriate Uterine Fundus: firm DVT Evaluation: No evidence of DVT seen on physical exam.  Recent Labs    11/13/23 2041 11/16/23 0512  HGB 9.3* 8.4*  HCT 30.5* 27.6*    Assessment/Plan: Nicole Benton is a 28 year old G1 now P0010 s/p SVD at 17.2 after IUFD in the setting of previable PPROM. -Routine care after fetal loss -uterine fibroid: fundal fibroid measuring 6.7x4.8x5.2cm. Significant pain from fibroid the last several weeks and possible cause of PPPROM. Consider myomectomy prior to future pregnancy.  -symptomatic suspected iron deficiency anemia, clinically significant for this hospitalization: Hgb 9.3-8.4. Plan iron infusion prior to discharge.  Dispo: anticipate discharge home today.   LOS: 3 days   Nicole Ana, MD 11/16/2023, 9:38 AM

## 2023-11-19 LAB — SURGICAL PATHOLOGY

## 2024-04-02 ENCOUNTER — Encounter (HOSPITAL_COMMUNITY): Payer: Self-pay | Admitting: Obstetrics and Gynecology

## 2024-04-02 NOTE — Pre-Procedure Instructions (Signed)
 Surgical Instructions   Your procedure is scheduled on :  Wednesday,  04-08-2024. Report to [facility] Main Entrance A at 11:00 A.M., then check in with the Admitting office. Any questions or running late day of surgery: call [phone]  Questions prior to your surgery date: call [phone], Monday-Friday, 8am-4pm. If you experience any cold or flu symptoms such as cough, fever, chills, shortness of breath, etc. between now and your scheduled surgery, please notify your surgeon office.    Remember:  Do not eat any food after midnight the night before your surgery.  You may have clear liquids from midnight night before surgery until 10:00 AM.  Clear liquids allowed are:  Water Carbonated Beverages Clear Tea (no milk, honey, etc.) Black Coffee Only (NO MILK, CREAM OR POWDERED CREAMER of any kind) Sport drinks, like Gatorade.  NO clear liquids after 10:00 AM day of surgery.  This includes no water,  candy,  gum,  and  mints.    Take these medicines the morning of surgery with A SIP OF WATER :  NONE   May take these medicines IF NEEDED: NONE    One week prior to surgery, STOP taking any Aspirin (unless otherwise instructed by your surgeon) Aleve, Naproxen, Ibuprofen , Motrin , Advil , Goody's, BC's, all herbal medications, fish oil, and non-prescription vitamins.                     Do NOT Smoke (Tobacco/Vaping) and Do Not drink alcohol for 24 hours prior to your procedure.  If you use a CPAP at night, you may bring your mask/headgear for your overnight stay.   You will be asked to remove any contacts, glasses, piercing's, hearing aid's, dentures/partials prior to surgery. Please bring cases/ contact solution/ etc.,  for these items day of surgery.   Patients discharged the day of surgery will not be allowed to drive home, and someone needs to stay with them for 24 hours.  SURGICAL WAITING ROOM VISITATION Patients may have no more than 2 support people in the waiting area -  these visitors may rotate.   Pre-op nurse will coordinate an appropriate time for 1 ADULT support person, who may not rotate, to accompany patient in pre-op.  Children under the age of 63 must have an adult with them who is not the patient and must remain in the main waiting area with an adult.  If the patient needs to stay at the hospital during part of their recovery, the visitor guidelines for inpatient rooms apply.  Please refer to the [facility] website for the visitor guidelines for any additional information.   If you received a COVID test during your pre-op visit  it is requested that you wear a mask when out in public, stay away from anyone that may not be feeling well and notify your surgeon if you develop symptoms. If you have been in contact with anyone that has tested positive in the last 10 days please notify you surgeon.      Pre-operative CHG Bathing Instructions   You can play a key role in reducing the risk of infection after surgery. Your skin needs to be as free of germs as possible. You can reduce the number of germs on your skin by washing with CHG (chlorhexidine gluconate) soap before surgery. CHG is an antiseptic soap that kills germs and continues to kill germs even after washing.   DO NOT use if you have an allergy to chlorhexidine/CHG or antibacterial soaps. If your  skin becomes reddened or irritated, stop using the CHG and notify one of our RN day of surgery.             TAKE A SHOWER THE NIGHT BEFORE SURGERY AND THE DAY OF SURGERY    Please keep in mind the following:  DO NOT shave, including legs and genital area, 48 hours prior to surgery.   You may shave your face before/day of surgery.  Place clean sheets on your bed the night before surgery Use a clean washcloth (not used since being washed) for each shower. DO NOT sleep with pet's night before surgery.  CHG Shower Instructions:  Wash your face and private area with normal soap. If you choose to wash  your hair, wash first with your normal shampoo.  After you use shampoo/soap, rinse your hair and body thoroughly to remove shampoo/soap residue.  Turn the water OFF and apply half the bottle of CHG soap to a CLEAN washcloth.  Apply CHG soap ONLY FROM YOUR NECK DOWN TO YOUR TOES (washing for 3-5 minutes)  DO NOT use CHG soap on face, private areas, open wounds, or sores.  Pay special attention to the area where your surgery is being performed.  If you are having back surgery, having someone wash your back for you may be helpful. Wait 2 minutes after CHG soap is applied, then you may rinse off the CHG soap.  Pat dry with a clean towel  Put on clean pajamas    Additional instructions for the day of surgery: DO NOT APPLY any lotions,  powder,  oils,  deodorants (may use underarm deodorant)  , cologne/  perfumes  or makeup Do not wear jewelry /  piercing's/  metal/  permanent jewelry must be removed prior to arrival day of surgery.  (No plastic piercing) Do not wear nail polish, gel polish, artificial nails, or any other type of covering on natural finger nails (toe nails are okay) Do not bring valuables to the hospital. [facility] is not responsible for valuables/personal belongings. Put on clean/comfortable clothes.  Please brush your teeth and rinse mouth out. Ask your nurse before applying any prescription medications to the skin.

## 2024-04-02 NOTE — Progress Notes (Signed)
 Spoke w/ via phone for pre-op interview--- pt Lab needs dos----   upt      Lab results------ lab appt 04-03-2024 @ 1130 getting CBC/ T&S COVID test -----patient states asymptomatic no test needed Arrive at ------- 1100 on 04-08-2024 NPO after MN NO Solid Food.  Clear liquids from MN until--- 1000 Pre-Surgery Ensure or G2: n/a  Med rec completed Medications to take morning of surgery ----- none Diabetic medication ----- n/a  GLP1 agonist last dose: n/a GLP1 instructions:  Patient instructed no nail polish to be worn day of surgery Patient instructed to bring photo id and insurance card day of surgery Patient aware to have Driver (ride ) / caregiver    for 24 hours after surgery - friend, rallph dusire Patient Special Instructions ----- will pick up soap and written instructions at lab appt Pre-Op special Instructions ----- n/a  Patient verbalized understanding of instructions that were given at this phone interview. Patient denies chest pain, sob, fever, cough at the interview.

## 2024-04-03 ENCOUNTER — Encounter (HOSPITAL_COMMUNITY)
Admission: RE | Admit: 2024-04-03 | Discharge: 2024-04-03 | Disposition: A | Discharge status: Home / Self Care | Source: Ambulatory Visit | Attending: Obstetrics and Gynecology | Admitting: Obstetrics and Gynecology

## 2024-04-03 DIAGNOSIS — Z01818 Encounter for other preprocedural examination: Secondary | ICD-10-CM

## 2024-04-03 DIAGNOSIS — Z01812 Encounter for preprocedural laboratory examination: Secondary | ICD-10-CM | POA: Diagnosis present

## 2024-04-03 LAB — CBC
HCT: 35.7 % — ABNORMAL LOW (ref 36.0–46.0)
Hemoglobin: 10.8 g/dL — ABNORMAL LOW (ref 12.0–15.0)
MCH: 23.2 pg — ABNORMAL LOW (ref 26.0–34.0)
MCHC: 30.3 g/dL (ref 30.0–36.0)
MCV: 76.6 fL — ABNORMAL LOW (ref 80.0–100.0)
Platelets: 193 K/uL (ref 150–400)
RBC: 4.66 MIL/uL (ref 3.87–5.11)
RDW: 15.1 % (ref 11.5–15.5)
WBC: 4.3 K/uL (ref 4.0–10.5)
nRBC: 0 % (ref 0.0–0.2)

## 2024-04-03 LAB — TYPE AND SCREEN
ABO/RH(D): A POS
Antibody Screen: NEGATIVE

## 2024-04-08 ENCOUNTER — Ambulatory Visit (HOSPITAL_COMMUNITY)
Admission: RE | Admit: 2024-04-08 | Discharge: 2024-04-09 | Disposition: A | Discharge status: Home / Self Care | Attending: Obstetrics and Gynecology | Admitting: Obstetrics and Gynecology

## 2024-04-08 ENCOUNTER — Ambulatory Visit (HOSPITAL_COMMUNITY): Admitting: Anesthesiology

## 2024-04-08 ENCOUNTER — Encounter (HOSPITAL_COMMUNITY)
Admission: RE | Disposition: A | Discharge status: Home / Self Care | Payer: Self-pay | Source: Home / Self Care | Attending: Obstetrics and Gynecology

## 2024-04-08 ENCOUNTER — Other Ambulatory Visit: Payer: Self-pay

## 2024-04-08 ENCOUNTER — Ambulatory Visit (HOSPITAL_BASED_OUTPATIENT_CLINIC_OR_DEPARTMENT_OTHER): Admitting: Anesthesiology

## 2024-04-08 ENCOUNTER — Encounter (HOSPITAL_COMMUNITY): Payer: Self-pay | Admitting: Obstetrics and Gynecology

## 2024-04-08 DIAGNOSIS — D259 Leiomyoma of uterus, unspecified: Secondary | ICD-10-CM | POA: Diagnosis not present

## 2024-04-08 DIAGNOSIS — R491 Aphonia: Secondary | ICD-10-CM | POA: Diagnosis not present

## 2024-04-08 DIAGNOSIS — D251 Intramural leiomyoma of uterus: Secondary | ICD-10-CM | POA: Insufficient documentation

## 2024-04-08 DIAGNOSIS — Z01818 Encounter for other preprocedural examination: Secondary | ICD-10-CM

## 2024-04-08 DIAGNOSIS — N83292 Other ovarian cyst, left side: Secondary | ICD-10-CM | POA: Diagnosis not present

## 2024-04-08 DIAGNOSIS — Z9889 Other specified postprocedural states: Secondary | ICD-10-CM

## 2024-04-08 HISTORY — PX: ROBOT ASSISTED MYOMECTOMY: SHX5142

## 2024-04-08 HISTORY — DX: Leiomyoma of uterus, unspecified: D25.9

## 2024-04-08 HISTORY — DX: Iron deficiency anemia, unspecified: D50.9

## 2024-04-08 LAB — POCT PREGNANCY, URINE: Preg Test, Ur: NEGATIVE

## 2024-04-08 SURGERY — MYOMECTOMY, ROBOT-ASSISTED
Anesthesia: General | Site: Uterus

## 2024-04-08 MED ORDER — ONDANSETRON HCL 4 MG/2ML IJ SOLN
INTRAMUSCULAR | Status: DC | PRN
Start: 2024-04-08 — End: 2024-04-08
  Administered 2024-04-08: 4 mg via INTRAVENOUS

## 2024-04-08 MED ORDER — IBUPROFEN 200 MG PO TABS: 600.0000 mg | ORAL_TABLET | Freq: Four times a day (QID) | ORAL | Status: AC | PRN

## 2024-04-08 MED ORDER — SCOPOLAMINE 1 MG/3DAYS TD PT72
MEDICATED_PATCH | TRANSDERMAL | Status: AC
  Filled 2024-04-08: qty 1

## 2024-04-08 MED ORDER — LACTATED RINGERS IV SOLN: INTRAVENOUS | Status: DC

## 2024-04-08 MED ORDER — ROCURONIUM BROMIDE 10 MG/ML (PF) SYRINGE
PREFILLED_SYRINGE | INTRAVENOUS | Status: DC | PRN
  Administered 2024-04-08: 10 mg via INTRAVENOUS
  Administered 2024-04-08: 60 mg via INTRAVENOUS
  Administered 2024-04-08: 40 mg via INTRAVENOUS
  Administered 2024-04-08: 10 mg via INTRAVENOUS

## 2024-04-08 MED ORDER — METHYLENE BLUE 20 MG/2ML IV SOSY
PREFILLED_SYRINGE | INTRAVENOUS | Status: AC
  Filled 2024-04-08: qty 2

## 2024-04-08 MED ORDER — MISOPROSTOL 200 MCG PO TABS
ORAL_TABLET | ORAL | Status: AC
  Filled 2024-04-08: qty 3

## 2024-04-08 MED ORDER — SODIUM CHLORIDE 0.9 % IV SOLN: INTRAVENOUS | Status: DC | PRN

## 2024-04-08 MED ORDER — GLYCOPYRROLATE PF 0.2 MG/ML IJ SOSY
PREFILLED_SYRINGE | INTRAMUSCULAR | Status: DC | PRN
  Administered 2024-04-08: .1 mg via INTRAVENOUS

## 2024-04-08 MED ORDER — HYDROMORPHONE HCL 1 MG/ML IJ SOLN
INTRAMUSCULAR | Status: DC | PRN
  Administered 2024-04-08 (×2): .5 mg via INTRAVENOUS

## 2024-04-08 MED ORDER — ACETAMINOPHEN 500 MG PO TABS
1000.0000 mg | ORAL_TABLET | ORAL | Status: AC
  Administered 2024-04-08: 1000 mg via ORAL

## 2024-04-08 MED ORDER — 0.9 % SODIUM CHLORIDE (POUR BTL) OPTIME
TOPICAL | Status: DC | PRN
  Administered 2024-04-08: 1000 mL

## 2024-04-08 MED ORDER — ONDANSETRON HCL 4 MG/2ML IJ SOLN
4.0000 mg | Freq: Four times a day (QID) | INTRAMUSCULAR | Status: DC | PRN
  Administered 2024-04-08: 4 mg via INTRAVENOUS
  Filled 2024-04-08: qty 2

## 2024-04-08 MED ORDER — ACETAMINOPHEN 500 MG PO TABS: 500.0000 mg | ORAL_TABLET | Freq: Four times a day (QID) | ORAL | Status: AC | PRN

## 2024-04-08 MED ORDER — ONDANSETRON HCL 4 MG PO TABS: 4.0000 mg | ORAL_TABLET | Freq: Four times a day (QID) | ORAL | Status: DC | PRN

## 2024-04-08 MED ORDER — MENTHOL 3 MG MT LOZG: 1.0000 | LOZENGE | OROMUCOSAL | Status: DC | PRN

## 2024-04-08 MED ORDER — MIDAZOLAM HCL 2 MG/2ML IJ SOLN
INTRAMUSCULAR | Status: DC | PRN
Start: 2024-04-08 — End: 2024-04-08
  Administered 2024-04-08: 2 mg via INTRAVENOUS

## 2024-04-08 MED ORDER — FENTANYL CITRATE (PF) 250 MCG/5ML IJ SOLN
INTRAMUSCULAR | Status: DC | PRN
  Administered 2024-04-08: 50 ug via INTRAVENOUS
  Administered 2024-04-08 (×2): 100 ug via INTRAVENOUS

## 2024-04-08 MED ORDER — SODIUM CHLORIDE 0.9 % IV SOLN
INTRAVENOUS | Status: DC | PRN
  Administered 2024-04-08: 60 mL

## 2024-04-08 MED ORDER — HYDROMORPHONE HCL 1 MG/ML IJ SOLN: 0.2500 mg | INTRAMUSCULAR | Status: DC | PRN

## 2024-04-08 MED ORDER — ACETAMINOPHEN 500 MG PO TABS
ORAL_TABLET | ORAL | Status: AC
  Filled 2024-04-08: qty 2

## 2024-04-08 MED ORDER — CHLORHEXIDINE GLUCONATE 0.12 % MT SOLN
15.0000 mL | Freq: Once | OROMUCOSAL | Status: AC
  Administered 2024-04-08: 15 mL via OROMUCOSAL

## 2024-04-08 MED ORDER — CEFAZOLIN SODIUM-DEXTROSE 2-4 GM/100ML-% IV SOLN
INTRAVENOUS | Status: AC
  Filled 2024-04-08: qty 100

## 2024-04-08 MED ORDER — OXYCODONE HCL 5 MG/5ML PO SOLN: 5.0000 mg | Freq: Once | ORAL | Status: DC | PRN

## 2024-04-08 MED ORDER — ROPIVACAINE HCL 5 MG/ML IJ SOLN
INTRAMUSCULAR | Status: AC
  Filled 2024-04-08: qty 30

## 2024-04-08 MED ORDER — SODIUM CHLORIDE 0.9 % IR SOLN
Status: DC | PRN
  Administered 2024-04-08: 1000 mL

## 2024-04-08 MED ORDER — HYDROMORPHONE HCL 1 MG/ML IJ SOLN
INTRAMUSCULAR | Status: AC
  Filled 2024-04-08: qty 1

## 2024-04-08 MED ORDER — OXYCODONE HCL 5 MG PO TABS
5.0000 mg | ORAL_TABLET | ORAL | Status: DC | PRN
  Administered 2024-04-08 – 2024-04-09 (×2): 10 mg via ORAL
  Filled 2024-04-08 (×2): qty 2

## 2024-04-08 MED ORDER — DEXAMETHASONE SODIUM PHOSPHATE 10 MG/ML IJ SOLN
INTRAMUSCULAR | Status: DC | PRN
  Administered 2024-04-08: 10 mg via INTRAVENOUS

## 2024-04-08 MED ORDER — CEFAZOLIN SODIUM-DEXTROSE 2-4 GM/100ML-% IV SOLN
2.0000 g | INTRAVENOUS | Status: AC
  Administered 2024-04-08: 2 g via INTRAVENOUS

## 2024-04-08 MED ORDER — FENTANYL CITRATE (PF) 250 MCG/5ML IJ SOLN
INTRAMUSCULAR | Status: AC
  Filled 2024-04-08: qty 5

## 2024-04-08 MED ORDER — ACETAMINOPHEN 500 MG PO TABS
1000.0000 mg | ORAL_TABLET | Freq: Four times a day (QID) | ORAL | Status: DC
  Administered 2024-04-08 – 2024-04-09 (×2): 1000 mg via ORAL
  Filled 2024-04-08 (×2): qty 2

## 2024-04-08 MED ORDER — GABAPENTIN 100 MG PO CAPS
100.0000 mg | ORAL_CAPSULE | Freq: Two times a day (BID) | ORAL | Status: DC
Start: 2024-04-08 — End: 2024-04-09
  Administered 2024-04-08 – 2024-04-09 (×2): 100 mg via ORAL
  Filled 2024-04-08 (×2): qty 1

## 2024-04-08 MED ORDER — SUGAMMADEX SODIUM 200 MG/2ML IV SOLN
INTRAVENOUS | Status: DC | PRN
  Administered 2024-04-08: 200 mg via INTRAVENOUS

## 2024-04-08 MED ORDER — SODIUM CHLORIDE (PF) 0.9 % IJ SOLN
PREFILLED_SYRINGE | INTRAVENOUS | Status: DC | PRN
  Administered 2024-04-08: 90 mL via INTRAMUSCULAR

## 2024-04-08 MED ORDER — LIDOCAINE 2% (20 MG/ML) 5 ML SYRINGE
INTRAMUSCULAR | Status: DC | PRN
  Administered 2024-04-08: 100 mg via INTRAVENOUS

## 2024-04-08 MED ORDER — PROPOFOL 500 MG/50ML IV EMUL
INTRAVENOUS | Status: DC | PRN
  Administered 2024-04-08: 200 ug/kg/min via INTRAVENOUS

## 2024-04-08 MED ORDER — POVIDONE-IODINE 10 % EX SWAB
2.0000 | Freq: Once | CUTANEOUS | Status: AC
  Administered 2024-04-08: 2 via TOPICAL

## 2024-04-08 MED ORDER — MIDAZOLAM HCL 2 MG/2ML IJ SOLN
INTRAMUSCULAR | Status: AC
  Filled 2024-04-08: qty 2

## 2024-04-08 MED ORDER — VASOPRESSIN 20 UNIT/ML IV SOLN
INTRAVENOUS | Status: AC
  Filled 2024-04-08: qty 1

## 2024-04-08 MED ORDER — BUPIVACAINE HCL (PF) 0.25 % IJ SOLN
INTRAMUSCULAR | Status: DC | PRN
  Administered 2024-04-08: 20 mL

## 2024-04-08 MED ORDER — CHLORHEXIDINE GLUCONATE 0.12 % MT SOLN
OROMUCOSAL | Status: AC
  Filled 2024-04-08: qty 15

## 2024-04-08 MED ORDER — VASOPRESSIN 20 UNIT/ML IV SOLN
INTRAVENOUS | Status: DC | PRN
  Administered 2024-04-08: 10 mL via INTRAMUSCULAR

## 2024-04-08 MED ORDER — PROPOFOL 10 MG/ML IV BOLUS
INTRAVENOUS | Status: DC | PRN
Start: 2024-04-08 — End: 2024-04-08
  Administered 2024-04-08: 200 mg via INTRAVENOUS

## 2024-04-08 MED ORDER — KETOROLAC TROMETHAMINE 30 MG/ML IJ SOLN: 30.0000 mg | Freq: Once | INTRAMUSCULAR | Status: DC

## 2024-04-08 MED ORDER — BUPIVACAINE HCL (PF) 0.25 % IJ SOLN
INTRAMUSCULAR | Status: AC
  Filled 2024-04-08: qty 30

## 2024-04-08 MED ORDER — SODIUM CHLORIDE (PF) 0.9 % IJ SOLN
INTRAMUSCULAR | Status: AC
  Filled 2024-04-08: qty 50

## 2024-04-08 MED ORDER — SODIUM CHLORIDE (PF) 0.9 % IJ SOLN
INTRAMUSCULAR | Status: AC
Start: 2024-04-08 — End: 2024-04-08
  Filled 2024-04-08: qty 100

## 2024-04-08 MED ORDER — SCOPOLAMINE 1 MG/3DAYS TD PT72
1.0000 | MEDICATED_PATCH | TRANSDERMAL | Status: DC
  Administered 2024-04-08: 1 mg via TRANSDERMAL

## 2024-04-08 MED ORDER — MISOPROSTOL 200 MCG PO TABS
600.0000 ug | ORAL_TABLET | Freq: Once | ORAL | Status: AC
  Administered 2024-04-08: 600 ug via VAGINAL
  Filled 2024-04-08: qty 3

## 2024-04-08 MED ORDER — OXYCODONE HCL 5 MG PO TABS: 5.0000 mg | ORAL_TABLET | Freq: Once | ORAL | Status: DC | PRN

## 2024-04-08 MED ORDER — ORAL CARE MOUTH RINSE: 15.0000 mL | Freq: Once | OROMUCOSAL | Status: AC

## 2024-04-08 MED ORDER — AMISULPRIDE (ANTIEMETIC) 5 MG/2ML IV SOLN: 10.0000 mg | Freq: Once | INTRAVENOUS | Status: DC | PRN

## 2024-04-08 SURGICAL SUPPLY — 52 items
BARRIER ADHS 3X4 INTERCEED (GAUZE/BANDAGES/DRESSINGS) IMPLANT
COVER BACK TABLE 60X90IN (DRAPES) ×2 IMPLANT
COVER TIP SHEARS 8 DVNC (MISCELLANEOUS) ×2 IMPLANT
DEFOGGER SCOPE WARM SEASHARP (MISCELLANEOUS) ×2 IMPLANT
DERMABOND ADVANCED .7 DNX12 (GAUZE/BANDAGES/DRESSINGS) ×2 IMPLANT
DRAPE ARM DVNC X/XI (DISPOSABLE) ×8 IMPLANT
DRAPE COLUMN DVNC XI (DISPOSABLE) ×2 IMPLANT
DRAPE UTILITY XL STRL (DRAPES) ×2 IMPLANT
DRIVER NDL MEGA SUTCUT DVNCXI (INSTRUMENTS) ×1 IMPLANT
DRIVER NDLE MEGA SUTCUT DVNCXI (INSTRUMENTS) ×2 IMPLANT
DURAPREP 26ML APPLICATOR (WOUND CARE) ×2 IMPLANT
ELECTRODE REM PT RTRN 9FT ADLT (ELECTROSURGICAL) ×2 IMPLANT
FORCEPS BPLR FENES DVNC XI (FORCEP) ×2 IMPLANT
FORCEPS PROGRASP DVNC XI (FORCEP) IMPLANT
FORCEPS TENACULUM DVNC XI (FORCEP) IMPLANT
GAUZE 4X4 16PLY ~~LOC~~+RFID DBL (SPONGE) IMPLANT
GLOVE BIO SURGEON STRL SZ 6.5 (GLOVE) ×6 IMPLANT
GLOVE BIOGEL PI IND STRL 7.0 (GLOVE) ×6 IMPLANT
HOLDER FOLEY CATH W/STRAP (MISCELLANEOUS) IMPLANT
IRRIGATION SUCT STRKRFLW 2 WTP (MISCELLANEOUS) ×2 IMPLANT
KIT PINK PAD W/HEAD ARM REST (MISCELLANEOUS) ×2 IMPLANT
KIT TURNOVER KIT B (KITS) ×2 IMPLANT
LEGGING LITHOTOMY PAIR STRL (DRAPES) ×2 IMPLANT
MANIPULATOR ADVINCU DEL 2.5 PL (MISCELLANEOUS) IMPLANT
MANIPULATOR ADVINCU DEL 3.0 PL (MISCELLANEOUS) IMPLANT
MANIPULATOR ADVINCU DEL 3.5 PL (MISCELLANEOUS) IMPLANT
MANIPULATOR ADVINCU DEL 4.0 PL (MISCELLANEOUS) IMPLANT
NDL INSUFFLATION 14GA 120MM (NEEDLE) ×1 IMPLANT
NEEDLE INSUFFLATION 14GA 120MM (NEEDLE) ×2 IMPLANT
OBTURATOR OPTICALSTD 8 DVNC (TROCAR) ×2 IMPLANT
PACK ROBOT WH (CUSTOM PROCEDURE TRAY) ×2 IMPLANT
PACK ROBOTIC GOWN (GOWN DISPOSABLE) ×2 IMPLANT
PAD OB MATERNITY 11 LF (PERSONAL CARE ITEMS) ×2 IMPLANT
PAD PREP 24X48 CUFFED NSTRL (MISCELLANEOUS) ×2 IMPLANT
POWDER SURGICEL 3.0 GRAM (HEMOSTASIS) IMPLANT
PROTECTOR NERVE ULNAR (MISCELLANEOUS) ×2 IMPLANT
SCISSORS MNPLR CVD DVNC XI (INSTRUMENTS) ×2 IMPLANT
SEAL UNIV 5-12 XI (MISCELLANEOUS) ×6 IMPLANT
SET IRRIG Y TYPE TUR BLADDER L (SET/KITS/TRAYS/PACK) ×2 IMPLANT
SET TRI-LUMEN FLTR TB AIRSEAL (TUBING) ×2 IMPLANT
SLEEVE SCD COMPRESS KNEE MED (STOCKING) ×2 IMPLANT
SPIKE FLUID TRANSFER (MISCELLANEOUS) ×2 IMPLANT
SUT MNCRL AB 4-0 PS2 18 (SUTURE) ×4 IMPLANT
SUT VICRYL 0 UR6 27IN ABS (SUTURE) ×3 IMPLANT
SUT VLOC 180 0 6IN GS21 (SUTURE) ×1 IMPLANT
SUT VLOC 180 0 9IN GS21 (SUTURE) ×6 IMPLANT
SYSTEM BAG RETRIEVAL 10MM (BASKET) ×1 IMPLANT
TIP ENDOSCOPIC SURGICEL (TIP) IMPLANT
TOWEL GREEN STERILE (TOWEL DISPOSABLE) ×2 IMPLANT
TRAY FOLEY W/BAG SLVR 14FR (SET/KITS/TRAYS/PACK) ×2 IMPLANT
TROCAR PORT AIRSEAL 5X120 (TROCAR) ×2 IMPLANT
TROCAR Z THREAD OPTICAL 12X100 (TROCAR) ×1 IMPLANT

## 2024-04-08 NOTE — Progress Notes (Signed)
 Patient voices desire to stay overnight.  Discussed with MD when she was at bedside because patient has discharge orders and Observation orders.  MD Ok with whatever the patient needs/wants.  Report given to 1s RN.

## 2024-04-08 NOTE — Op Note (Signed)
 04/08/2024  8:06 PM  PATIENT:  Nicole Benton  28 y.o. female  PRE-OPERATIVE DIAGNOSIS:  uterine leiomyoma  POST-OPERATIVE DIAGNOSIS:  uterine leiomyoma  PROCEDURE:  Procedure(s): MYOMECTOMY, ROBOT-ASSISTED (N/A)  SURGEON:  Surgeons and Role:    * Claire Rubie LABOR, MD - Primary    * Sarrah Browning, MD - Assisting  ANESTHESIA:   local and general  EBL:  30 mL   BLOOD ADMINISTERED:none  DRAINS: none   LOCAL MEDICATIONS USED:  MARCAINE     and OTHER Ropivacaine   SPECIMEN:  uterine fibroid  DISPOSITION OF SPECIMEN:  PATHOLOGY  COUNTS:  YES  Findings:   Pelvic/Bimanual: Normal appearing external female genitalia. Enlarged, 12 week sized, mobile uterus. Normal appearing vagina and cervix.   Laparoscopic: Enlarged uterus with fundal fibroid. Normal bilateral ovaries and bilateral fallopian tubes. Good spill of methylene blue  from bilateral fallopian tubes. No lesions or adhesions seen on any pelvic structures, including the ovaries, peritoneum under ovaries, uterus, tubes, cul de sac, anterior bladder or anterior abdominal wall.  The liver edge, diaphragm, stomach, and gallbladder were all normal appearing. Appendix was normal.   Surgical procedure:  After adequate anesthesia was achieved the patient was positioned, prepped and draped in usual sterile fashion.  A routine surgical time out was performed. A speculum was placed in the vagina. A tenaculum was placed on the anterior lip of the cervix and an Acorn uterine manipulator was placed. The speculum was removed. The bladder catheterized with a foley.   Attention was turned to the abdomen where a 0.5cm incision was made at the cranial aspect of the umbilicus.  The veress needle was introduced without aspiration of bowel contents or blood. The abdomen was insufflated with CO2 gas and then the camera was introduced, confirming intraperitoneal insufflation and no injury to underlying structures. The 8.59mm Robotic trocar  was placed at the umbilicus and the other three trocar sites were marked out, all approximately 10 cm from each other and the camera.  Two 8.5 mm trocars were placed on either side of the camera port and a 5 mm assistant port was placed 3 cm above the line of the other trocars.  All trocars were inserted under direct visualization of the camera.  The patient was placed in trendelenburg and then the Robot docked.  The fenestrated bipolar were placed on arm 2 and the monopolar scissors on arm 4 and introduced under direct visualization of the camera.   I then broke scrub and sat down at the console.  The above findings were noted and the ureters identified well out of the field of dissection. The fibroid was injected with 10cc of dilute vasopressin . The monopolar scissors were used to incise the uterus over the fibroid. The fibroid was shelled out using laparoscopic tenaculum for counter-traction. The fibroid was placed in the right upper quadrant. The uterine cavity was filled with dilute methylene blue . 0 V-Loc was used to close the endometrial cavity in a running fashion. The myometrium was then closed in two layers using 0 V-Loc in a running fashion. The uterine serosa was then closed using 0 V-Loc in a baseball stitch fashion. Minimal electrocautery was used on oozing serosa, but the incision site was noted to be hemostatic. The dissection site had been well away from the ureters and bladder the entire time. The intra-abdominal insufflation was reduced to with good hemostasis noted of the uterine repair. Insufflation was turned back up to . I moved the fibroid into the anterior cul-de-sac.  I scrubbed back in and the robot was undocked. The umbilical robot port was removed and replaced with a 10mm laparoscopic trocar under direct visualization after extension of the skin incision. A 10mm Endocatch bag was introduced into the pelvis. The fibroid was placed into the Endocatch bag laparoscopically  and the bag was brought up to the anterior abdominal wall. All trocars were removed.   I further extended the skin and fascial incision to about 3cm total superior to the umbilicus. I performed sharp manual morcellation of the fibroid within the bag using a scalpel until the fibroid could be completely removed from the abdomen, along with the Endocatch bag.   The abdomen was desufflated. S-retractors were used to provide good visualization of the fascial edge, and the supra-umbilical fascia was closed using 0-Vicryl. 50cc of Ropivacaine  was introduced into the pelvis through the left abdominal incision. All skin incisions were closed with 4-0 monocryl, infused with Marcaine , and covered with Dermabond.  The uterine manipulator was removed from the uterus and good hemostasis was noted of the tenaculum site and uterus. The bladder foley was removed.   The patient tolerate the procedure well and was in stable condition, being woken up by anesthesia when I left the room. She was then taken to the recovery room in stable condition.   Rubie Husky, MD

## 2024-04-08 NOTE — Transfer of Care (Signed)
 Immediate Anesthesia Transfer of Care Note  Patient: Nicole Benton  Procedure(s) Performed: MYOMECTOMY, ROBOT-ASSISTED (Uterus)  Patient Location: PACU  Anesthesia Type:General  Level of Consciousness: drowsy and patient cooperative  Airway & Oxygen Therapy: Patient Spontanous Breathing  Post-op Assessment: Report given to RN, Post -op Vital signs reviewed and stable, and Patient moving all extremities X 4  Post vital signs: Reviewed and stable  Last Vitals:  Vitals Value Taken Time  BP 148/89 04/08/24 20:19  Temp    Pulse 74 04/08/24 20:20  Resp 14 04/08/24 20:20  SpO2 95 % 04/08/24 20:20  Vitals shown include unfiled device data.  Last Pain:  Vitals:   04/08/24 1148  TempSrc: Oral  PainSc: 0-No pain      Patients Stated Pain Goal: 7 (04/08/24 1148)  Complications: No notable events documented.

## 2024-04-08 NOTE — Progress Notes (Addendum)
 Updated Pre-Op H&P  The patient was seen and examined by me. She is feeling well today. She denies any changes to her health history or medications. We briefly went over risks of the procedure again. All questions were answered. She placed vaginal misoprostol  about an hour ago.   Vitals:   04/08/24 1148  BP: 139/86  Pulse: 78  Resp: 16  Temp: 98 F (36.7 C)  SpO2: 100%    Exam: Gen- NAD, A+Ox3 Cardiac- well perfused Resp- normal WOB on room air Extr- no trauma Psych- normal mood and affect  Hgb 10.8 Platelets 193 UPT neg   A/P Move forward with surgery as planned. Brother-in-law Elgin is support person at the bedside.

## 2024-04-08 NOTE — H&P (Signed)
 pre op, 03-26-2024 surgery 04/08/24 Performed by RUBIE HUSKY, MD, OB/GYN, [phone] Reason for Visit None recorded Assessment & Plan Missed period Received and discussed pregnancy test, urine results Pre-op evaluation Move forward with surgery as scheduled.  History of Present Illness Pt. here for pre op visit; RA Myomectomy scheduled on 09/24, has received 2 Lupron injections/tb  Intramural leiomyoma of uterus Gyn US . Anteverted fibroid uterus. Fibroid #1 is fundal, type 2-5, measuring 6.2x5.6x6.1cm, likely distorting the endometrial cavity. Fibroid #2 is right posterior, type 2-5, measuring 1.4x.12x1.6cm. Endometrial stripe measuring 8.36mm. Bilateral ovaries visualized and wnl. Classic appearing corpus albicans cyst on left ovary.   Fibroid uterus with h/o 17 week PPPROM and significant pain during pregnancy. Strongly desires surgical management of fibroids for this reason. Briefly discussed surgical plan and R/B/A. Will request case and discuss further at pre-op visit.   01/27/24: discussed R/B/A of depot lupron prior to surgery, patient agrees she would like to undergo depot lupron treatment. Will plan for two months prior to myomectomy.  Start Lupron Depot 3.75 mg intramuscular syringe kit 1 kit every 4 weeks as directed  03/26/2024 Has been having hot flushes. No vaginal bleeding. Has been abstinent (husband lives in [place]). Some minor pain.   Allergies: none PMH: none OB Hx: PPROM, IUFD at [redacted]w[redacted]d followed by vaginal delivery Meds: s/p 2x lupron injections PSH: none BMI: 33.2 Pre-op abx: 2g Ancef  VTE ppx: caprini score 3; plan SCDs.   Imaging: Gyn US  01/07/24. Anteverted fibroid uterus. Fibroid #1 is fundal, type 2-5, measuring 6.2x5.6x6.1cm, likely distorting the endometrial cavity. Fibroid #2 is right posterior, type 2-5, measuring 1.4x.12x1.6cm. Endometrial stripe measuring 8.76mm. Bilateral ovaries visualized and wnl. Classic appearing corpus albicans cyst on left  ovary.  Today we discussed the risks, benefits, and alternatives of the robot assisted myomectomy, possible cystectomy, and other indicated procedures. Specific procedure risks discussed included risk of bleeding, infection (wound infection or pelvic infection) and damage to other structures. Specifically discussed risk of damage to blood vessels, nerves, bowel, bladder, and ureters. She understands that any of the above could necessitate further laparoscopy or laparotomy intraop, as well as additional treatment or surgeries (either on the day of surgery or possibly in the future). In terms of risk of bleeding, pt accepts a blood transfusion if necessary and understands the risks involved in a blood transfusion including risk of transfusion reaction and risk of Hepatitis or HIV transmission. She understands that after myomectomy procedure, she will need to have a C-section by 36-37 weeks for any future pregnancies. After discussion of the above, pt wishes to proceed with the scheduled procedure.  Review of Systems ROS as noted in the HPI Screening None recorded Physical Exam Annual Gyn  Chaperone Chaperone: present  Constitutional General Appearance: healthy-appearing, well-nourished, well-developed  Psychiatric Orientation: to time, to place, to person Mood and Affect: active and alert, normal mood, normal affect  Skin Appearance: no rashes, no lesions  Lungs Respiratory Effort: no intercostal retractions, no accessory muscle usage  Cardiovascular Peripheral Vascular: (well perfused)  Female Genitalia Vulva: no masses, no atrophy, no lesions Vagina: no tenderness, no erythema, no abnormal vaginal discharge, no vesicle(s) or ulcers, no cystocele, no rectocele (overall discomfort noted with exams) Cervix: grossly normal, no discharge, no cervical motion tenderness Uterus: midline, no uterine prolapse, mobile, non-tender, enlarged, fibroids (10w sized) Bladder/Urethra: normal meatus,  no urethral discharge, no urethral mass, bladder non distended, Urethra well supported Adnexa/Parametria: no parametrial tenderness, no parametrial mass, no adnexal tenderness, no ovarian mass Procedure  Documentation None recorded

## 2024-04-08 NOTE — Discharge Instructions (Signed)
 Nothing in the vagina for 6 weeks. No bath- shower ok. Increase fiber and hydration. Over the counter ibuprofen  and Tylenol  for pain; see instructions on medication bottle.  Colace or Miralax for over the counter for stool softener if needed.

## 2024-04-08 NOTE — Anesthesia Preprocedure Evaluation (Addendum)
 Anesthesia Evaluation  Patient identified by MRN, date of birth, ID band Patient awake    Reviewed: Allergy & Precautions, NPO status , Patient's Chart, lab work & pertinent test results  History of Anesthesia Complications Negative for: history of anesthetic complications  Airway Mallampati: II  TM Distance: >3 FB Neck ROM: Full    Dental  (+) Dental Advisory Given   Pulmonary neg pulmonary ROS   Pulmonary exam normal breath sounds clear to auscultation       Cardiovascular negative cardio ROS  Rhythm:Regular Rate:Normal  TTE 03/12/2017: Study Conclusions   - Left ventricle: The cavity size was normal. Wall thickness was    normal. Systolic function was normal. The estimated ejection    fraction was in the range of 55% to 60%. Wall motion was normal;    there were no regional wall motion abnormalities. There was no    evidence of elevated ventricular filling pressure by Doppler    parameters.  - Tricuspid valve: There was mild regurgitation.     Neuro/Psych  Headaches, neg Seizures    GI/Hepatic negative GI ROS, Neg liver ROS,,,  Endo/Other  negative endocrine ROS    Renal/GU negative Renal ROS     Musculoskeletal   Abdominal  (+) + obese  Peds  Hematology  (+) Blood dyscrasia, anemia Lab Results      Component                Value               Date                      WBC                      4.3                 04/03/2024                HGB                      10.8 (L)            04/03/2024                HCT                      35.7 (L)            04/03/2024                MCV                      76.6 (L)            04/03/2024                PLT                      193                 04/03/2024              Anesthesia Other Findings   Reproductive/Obstetrics Uterine leiomyoma                              Anesthesia Physical Anesthesia Plan  ASA: 2  Anesthesia Plan:  General   Post-op  Pain Management: Tylenol  PO (pre-op)*   Induction: Intravenous  PONV Risk Score and Plan: 3 and Ondansetron , Dexamethasone , Treatment may vary due to age or medical condition, Midazolam  and Scopolamine  patch - Pre-op  Airway Management Planned: Oral ETT  Additional Equipment:   Intra-op Plan:   Post-operative Plan: Extubation in OR  Informed Consent: I have reviewed the patients History and Physical, chart, labs and discussed the procedure including the risks, benefits and alternatives for the proposed anesthesia with the patient or authorized representative who has indicated his/her understanding and acceptance.     Dental advisory given  Plan Discussed with: CRNA and Anesthesiologist  Anesthesia Plan Comments: (Risks of general anesthesia discussed including, but not limited to, sore throat, hoarse voice, chipped/damaged teeth, injury to vocal cords, nausea and vomiting, allergic reactions, lung infection, heart attack, stroke, and death. All questions answered. )         Anesthesia Quick Evaluation

## 2024-04-08 NOTE — Anesthesia Procedure Notes (Signed)
 Procedure Name: Intubation Date/Time: 04/08/2024 5:12 PM  Performed by: Mollie Olivia SAUNDERS, CRNAPre-anesthesia Checklist: Patient identified, Emergency Drugs available, Suction available and Patient being monitored Patient Re-evaluated:Patient Re-evaluated prior to induction Oxygen Delivery Method: Circle system utilized Preoxygenation: Pre-oxygenation with 100% oxygen Induction Type: IV induction Ventilation: Mask ventilation without difficulty Laryngoscope Size: Mac and 3 Grade View: Grade I Tube type: Oral Tube size: 7.0 mm Number of attempts: 1 Airway Equipment and Method: Stylet and Oral airway Placement Confirmation: ETT inserted through vocal cords under direct vision, positive ETCO2 and breath sounds checked- equal and bilateral Secured at: 22 cm Tube secured with: Tape Dental Injury: Teeth and Oropharynx as per pre-operative assessment

## 2024-04-09 ENCOUNTER — Encounter (HOSPITAL_COMMUNITY): Payer: Self-pay | Admitting: Obstetrics and Gynecology

## 2024-04-09 DIAGNOSIS — D251 Intramural leiomyoma of uterus: Secondary | ICD-10-CM | POA: Diagnosis not present

## 2024-04-09 MED ORDER — OXYCODONE HCL 5 MG PO TABS: 5.0000 mg | ORAL_TABLET | ORAL | 0 refills | Status: AC | PRN

## 2024-04-09 NOTE — Discharge Summary (Signed)
 Discharge Summary   Nicole Benton is a 28 year old female who underwent a robotic assisted laparoscopic myomectomy on 04/08/2024 with Dr. Claire. There were no complications with the procedure; see op note for further details. She tolerated the procedure well. On POD#1, she was doing well, had normal vital signs, was ambulating, tolerating PO, pain was well controlled, and she was amendable to discharge from the hospital. She was given routine post-operative precautions. She will be seen in two weeks by Dr. Claire for a routine follow-up visit.   Rubie Claire, MD 04/09/2024

## 2024-04-09 NOTE — Progress Notes (Signed)
 Subjective: Patient reports doing well. She has lost her voice. Having some abdominal tenderness (5 out of 10), but has not had any pain medication in a while. She has been ambulating, voiding well, tolerating PO without nausea or vomiting. Minimal vaginal bleeding. We went over the photos from her surgery.   Objective: I have reviewed patient's vital signs, intake and output, and medications.     04/09/2024    8:06 AM 04/09/2024    4:23 AM 04/09/2024   12:17 AM  Vitals with BMI  Systolic 127 123 870  Diastolic 62 72 69  Pulse 83 73 65     Physical Exam:  General: no acute distress Pulm: normal work of breathing on room air Abd: soft, appropriately tender to palpation. Incisions sites c/d/I with surgical glue in place Card: well perfused MSK: normal ROM Neuro: no focal deficits, oriented x3 Psych: normal mood, normal thought    Assessment/Plan: POD#1 doing well. Stable for discharge home with routine post-op precautions.   LOS: 0 days    Rubie DELENA Husky, MD 04/09/2024, 10:33 AM

## 2024-04-09 NOTE — Anesthesia Postprocedure Evaluation (Signed)
 Anesthesia Post Note  Patient: Nicole Benton  Procedure(s) Performed: MYOMECTOMY, ROBOT-ASSISTED (Uterus)     Patient location during evaluation: PACU Anesthesia Type: General Level of consciousness: awake and alert Pain management: pain level controlled Vital Signs Assessment: post-procedure vital signs reviewed and stable Respiratory status: spontaneous breathing, nonlabored ventilation, respiratory function stable and patient connected to nasal cannula oxygen Cardiovascular status: blood pressure returned to baseline and stable Postop Assessment: no apparent nausea or vomiting Anesthetic complications: no   No notable events documented.  Last Vitals:  Vitals:   04/08/24 2300 04/09/24 0017  BP:  129/69  Pulse:    Resp:  18  Temp:  37.2 C  SpO2: 100% 99%    Last Pain:  Vitals:   04/09/24 0017  TempSrc: Oral  PainSc:    Pain Goal: Patients Stated Pain Goal: 7 (04/08/24 1148)                 Charlynn Salih L Calene Paradiso

## 2024-04-10 LAB — SURGICAL PATHOLOGY

## 2024-05-07 ENCOUNTER — Ambulatory Visit (INDEPENDENT_AMBULATORY_CARE_PROVIDER_SITE_OTHER): Admitting: Family Medicine

## 2024-05-07 VITALS — BP 121/75 | HR 82 | Temp 98.3°F | Resp 16 | Ht 65.0 in | Wt 210.2 lb

## 2024-05-07 DIAGNOSIS — Z0000 Encounter for general adult medical examination without abnormal findings: Secondary | ICD-10-CM

## 2024-05-07 NOTE — Progress Notes (Signed)
 Subjective:    CC: Annual Physical Exam  HPI:  Nicole Benton is a 28 y.o. presenting for annual physical  I reviewed the past medical history, family history, social history, surgical history, and allergies today and no changes were needed.  Please see the problem list section below in epic for further details.  Past Medical History: Past Medical History:  Diagnosis Date   History of IUFD 11/13/2023   admission --- PPROM previable,  17 weeks   History of syncope 02/2017   ED/ admission ;  neuro work-up  dx acute metabolic encephalothy , unknown etiology;  during admission on ecg monitor noted 2:1 block per cardio do echo (normal) continue to monitor after discharge outpt follow-up;   pt never follwed up w/ neuro or cardio   History of syncope 01/22/2019   (04-02-2024  pt stated no near syncope/ syncope since 2020 and did not follow up outpt w/ neuro or cardio)ED--- near syncope;  MRI negative , recommended neuro/ cardio followed up since  pt did not do previously in 2018   Iron  deficiency anemia    Uterine fibroid    Past Surgical History: Past Surgical History:  Procedure Laterality Date   NO PAST SURGERIES     ROBOT ASSISTED MYOMECTOMY N/A 04/08/2024   Procedure: MYOMECTOMY, ROBOT-ASSISTED;  Surgeon: Claire Rubie LABOR, MD;  Location: [facility] OR;  Service: Gynecology;  Laterality: N/A;   Social History: Social History   Socioeconomic History   Marital status: Single    Spouse name: Not on file   Number of children: Not on file   Years of education: Not on file   Highest education level: Bachelor's degree (e.g., BA, AB, BS)  Occupational History   Not on file  Tobacco Use   Smoking status: Never   Smokeless tobacco: Never  Vaping Use   Vaping status: Never Used  Substance and Sexual Activity   Alcohol use: No   Drug use: Never   Sexual activity: Not on file  Other Topics Concern   Not on file  Social History Narrative   Not on file   Social Drivers of Health    Financial Resource Strain: Low Risk  (05/04/2024)   Overall Financial Resource Strain (CARDIA)    Difficulty of Paying Living Expenses: Not hard at all  Food Insecurity: No Food Insecurity (05/04/2024)   Hunger Vital Sign    Worried About Running Out of Food in the Last Year: Never true    Ran Out of Food in the Last Year: Never true  Transportation Needs: No Transportation Needs (05/04/2024)   PRAPARE - Administrator, Civil Service (Medical): No    Lack of Transportation (Non-Medical): No  Physical Activity: Insufficiently Active (05/04/2024)   Exercise Vital Sign    Days of Exercise per Week: 2 days    Minutes of Exercise per Session: 60 min  Stress: No Stress Concern Present (05/04/2024)   [employer] of Occupational Health - Occupational Stress Questionnaire    Feeling of Stress: Only a little  Social Connections: Moderately Isolated (05/04/2024)   Social Connection and Isolation Panel    Frequency of Communication with Friends and Family: More than three times a week    Frequency of Social Gatherings with Friends and Family: Once a week    Attends Religious Services: 1 to 4 times per year    Active Member of [employer] or Organizations: No    Attends Banker Meetings: Not on file  Marital Status: Never married   Family History: Family History  Problem Relation Age of Onset   Diabetes Paternal Grandmother    Diabetes Maternal Grandmother    Heart disease Father    Hypertension Father    Kidney disease Father    Hypertension Mother    Allergies: No Known Allergies Medications: See med rec.  Review of Systems: No headache, visual changes, nausea, vomiting, diarrhea, constipation, dizziness, abdominal pain, skin rash, fevers, chills, night sweats, swollen lymph nodes, weight loss, chest pain, body aches, joint swelling, muscle aches, shortness of breath, mood changes, visual or auditory hallucinations.  Objective:    BP 121/75 (BP  Location: Right Arm, Patient Position: Sitting, Cuff Size: Normal)   Pulse 82   Temp 98.3 F (36.8 C) (Oral)   Resp 16   Ht 5' 5 (1.651 m)   Wt 210 lb 3.2 oz (95.3 kg)   LMP 02/19/2024   SpO2 97%   BMI 34.98 kg/m   General: Well Developed, well nourished, and in no acute distress.  Neuro: Alert and oriented x3, extra-ocular muscles intact, sensation grossly intact. Cranial nerves II through XII are intact, motor, sensory, and coordinative functions are all intact. HEENT: Normocephalic, atraumatic, pupils equal round reactive to light, neck supple, no masses, no lymphadenopathy, thyroid  nonpalpable. Oropharynx, nasopharynx, external ear canals are unremarkable. Skin: Warm and dry, no rashes noted.  Cardiac: Regular rate and rhythm, no murmurs rubs or gallops. Respiratory: Clear to auscultation bilaterally. Not using accessory muscles, speaking in full sentences. Abdominal: Soft, mild tenderness with healing surgical wound, nondistended, positive bowel sounds, no masses, no organomegaly. Musculoskeletal: Shoulder, elbow, wrist, hip, knee, ankle stable, and with full range of motion.  Impression and Recommendations:    Wellness examination Assessment & Plan: Routine HCM labs ordered. HCM reviewed/discussed. Anticipatory guidance regarding healthy weight, lifestyle and choices given. Recommend healthy diet.  Recommend approximately 150 minutes/week of moderate intensity exercise Recommend regular dental and vision exams Always use seatbelt/lap and shoulder restraints Recommend using smoke alarms and checking batteries at least twice a year Recommend using sunscreen when outside Discussed immunization recommendations  Orders: -     CBC with Differential/Platelet; Future -     Comprehensive metabolic panel with GFR; Future -     Hemoglobin A1c; Future -     Lipid panel; Future -     TSH Rfx on Abnormal to Free T4; Future  Patient with history of iron  deficiency anemia.  She did  have recent myomectomy performed.  Still recovering currently.  With labs for physical, we will be checking a CBC.  Can provide further recommendations pending results  Return in about 1 year (around 05/07/2025) for CPE.   ___________________________________________ Bassem Bernasconi de [place], MD, ABFM, CAQSM Primary Care and Sports Medicine [facility]

## 2024-05-07 NOTE — Assessment & Plan Note (Signed)

## 2024-05-13 ENCOUNTER — Other Ambulatory Visit (HOSPITAL_BASED_OUTPATIENT_CLINIC_OR_DEPARTMENT_OTHER): Payer: Self-pay | Admitting: *Deleted

## 2024-05-13 ENCOUNTER — Ambulatory Visit (HOSPITAL_BASED_OUTPATIENT_CLINIC_OR_DEPARTMENT_OTHER)

## 2024-05-13 DIAGNOSIS — Z0000 Encounter for general adult medical examination without abnormal findings: Secondary | ICD-10-CM

## 2024-05-14 LAB — HEMOGLOBIN A1C
Est. average glucose Bld gHb Est-mCnc: 114 mg/dL
Hgb A1c MFr Bld: 5.6 % (ref 4.8–5.6)

## 2024-05-14 LAB — CBC WITH DIFFERENTIAL/PLATELET
Basophils Absolute: 0 x10E3/uL (ref 0.0–0.2)
Basos: 0 %
EOS (ABSOLUTE): 0.1 x10E3/uL (ref 0.0–0.4)
Eos: 3 %
Hematocrit: 33.2 % — ABNORMAL LOW (ref 34.0–46.6)
Hemoglobin: 10.2 g/dL — ABNORMAL LOW (ref 11.1–15.9)
Immature Grans (Abs): 0 x10E3/uL (ref 0.0–0.1)
Immature Granulocytes: 0 %
Lymphocytes Absolute: 1.9 x10E3/uL (ref 0.7–3.1)
Lymphs: 40 %
MCH: 24 pg — ABNORMAL LOW (ref 26.6–33.0)
MCHC: 30.7 g/dL — ABNORMAL LOW (ref 31.5–35.7)
MCV: 78 fL — ABNORMAL LOW (ref 79–97)
Monocytes Absolute: 0.4 x10E3/uL (ref 0.1–0.9)
Monocytes: 8 %
Neutrophils Absolute: 2.3 x10E3/uL (ref 1.4–7.0)
Neutrophils: 49 %
Platelets: 229 x10E3/uL (ref 150–450)
RBC: 4.25 x10E6/uL (ref 3.77–5.28)
RDW: 16.3 % — ABNORMAL HIGH (ref 11.7–15.4)
WBC: 4.7 x10E3/uL (ref 3.4–10.8)

## 2024-05-14 LAB — COMPREHENSIVE METABOLIC PANEL WITH GFR
ALT: 16 IU/L (ref 0–32)
AST: 19 IU/L (ref 0–40)
Albumin: 4.3 g/dL (ref 4.0–5.0)
Alkaline Phosphatase: 68 IU/L (ref 41–116)
BUN/Creatinine Ratio: 23 (ref 9–23)
BUN: 10 mg/dL (ref 6–20)
Bilirubin Total: 0.4 mg/dL (ref 0.0–1.2)
CO2: 23 mmol/L (ref 20–29)
Calcium: 9.1 mg/dL (ref 8.7–10.2)
Chloride: 103 mmol/L (ref 96–106)
Creatinine, Ser: 0.43 mg/dL — ABNORMAL LOW (ref 0.57–1.00)
Globulin, Total: 2.8 g/dL (ref 1.5–4.5)
Glucose: 83 mg/dL (ref 70–99)
Potassium: 3.9 mmol/L (ref 3.5–5.2)
Sodium: 140 mmol/L (ref 134–144)
Total Protein: 7.1 g/dL (ref 6.0–8.5)
eGFR: 137 mL/min/1.73 (ref >59)

## 2024-05-14 LAB — LIPID PANEL
Chol/HDL Ratio: 4.6 ratio — ABNORMAL HIGH (ref 0.0–4.4)
Cholesterol, Total: 297 mg/dL — ABNORMAL HIGH (ref 100–199)
HDL: 64 mg/dL (ref >39)
LDL Chol Calc (NIH): 207 mg/dL — ABNORMAL HIGH (ref 0–99)
Triglycerides: 143 mg/dL (ref 0–149)
VLDL Cholesterol Cal: 26 mg/dL (ref 5–40)

## 2024-05-14 LAB — TSH RFX ON ABNORMAL TO FREE T4: TSH: 2.2 u[IU]/mL (ref 0.450–4.500)

## 2024-05-15 ENCOUNTER — Ambulatory Visit (HOSPITAL_BASED_OUTPATIENT_CLINIC_OR_DEPARTMENT_OTHER): Payer: Self-pay | Admitting: Family Medicine

## 2024-05-15 ENCOUNTER — Telehealth (HOSPITAL_BASED_OUTPATIENT_CLINIC_OR_DEPARTMENT_OTHER): Payer: Self-pay | Admitting: Family Medicine

## 2024-05-15 DIAGNOSIS — E785 Hyperlipidemia, unspecified: Secondary | ICD-10-CM

## 2024-05-15 NOTE — Telephone Encounter (Signed)
 Copied from CRM [id number]. Topic: Clinical - Lab/Test Results >> May 15, 2024 11:27 AM Sophia H wrote: Reason for CRM: Returning missed call to [place] - [facility] states busy with patients.  Please return call when you get a chance # [phone]

## 2024-05-18 NOTE — Telephone Encounter (Signed)
 Routing to Dr. De [place] about pt okay with having referral to lipid clinic placed.

## 2024-05-18 NOTE — Telephone Encounter (Signed)
 Pt was attempted to be called to have recent labwork discussed. Pt has reviewed labs via mychart. Sent pt a message based off of recent labs. Will await mychart response. Closing this encounter due to having mychart encounter open from result note.

## 2024-06-09 ENCOUNTER — Institutional Professional Consult (permissible substitution) (HOSPITAL_BASED_OUTPATIENT_CLINIC_OR_DEPARTMENT_OTHER): Admitting: Internal Medicine

## 2024-06-25 ENCOUNTER — Encounter (HOSPITAL_BASED_OUTPATIENT_CLINIC_OR_DEPARTMENT_OTHER): Payer: Self-pay | Admitting: Internal Medicine

## 2024-06-25 ENCOUNTER — Telehealth (HOSPITAL_BASED_OUTPATIENT_CLINIC_OR_DEPARTMENT_OTHER): Payer: Self-pay | Admitting: *Deleted

## 2024-06-25 ENCOUNTER — Ambulatory Visit (INDEPENDENT_AMBULATORY_CARE_PROVIDER_SITE_OTHER): Admitting: Internal Medicine

## 2024-06-25 VITALS — BP 110/64 | HR 80 | Ht 65.0 in | Wt 215.0 lb

## 2024-06-25 DIAGNOSIS — E7800 Pure hypercholesterolemia, unspecified: Secondary | ICD-10-CM | POA: Diagnosis not present

## 2024-06-25 NOTE — Patient Instructions (Addendum)
 Medication Instructions:   Your physician recommends that you continue on your current medications as directed. Please refer to the Current Medication list given to you today.  *If you need a refill on your cardiac medications before your next appointment, please call your pharmacy*   Testing/Procedures:  Genetic test for E78.01 ordered ([facility]) Cheek swab completed in office Specimen and necessary paperwork mailed. ID: [id number]    Follow-Up:  AS NEEDED WITH DR. HILTY IN LIPID CLINIC

## 2024-06-25 NOTE — Telephone Encounter (Signed)
 Genetic test for E78.01 ordered ([facility]) Cheek swab completed in office Specimen and necessary paperwork mailed. ID: [id number]

## 2024-06-26 NOTE — Progress Notes (Signed)
 LIPID CLINIC CONSULT NOTE  Chief Complaint:  Manage dyslipidemia  Primary Care Physician: de Cuba, Nicole PARAS, MD  Primary Cardiologist:  None  HPI:  Nicole Benton is a 28 y.o. female who is being seen today for the evaluation of lipidemia at the request of de [place], Nicole PARAS, MD. this is a pleasant 65-year-old female currently referred for evaluation and management of dyslipidemia.  She is originally from [place] and denies any family history of heart disease or high cholesterol however was found to have significant recent elevation in her cholesterol with total 297, triglycerides 143, HDL 63 and LDL 207.  Prior to that her LDL was 138.  She is not on any medications other than some prenatal vitamins which could be associated with this increase.  She had a normal TSH in October.  Diet is variable but not particularly high in saturated fats.  PMHx:  Past Medical History:  Diagnosis Date   History of IUFD 11/13/2023   admission --- PPROM previable,  17 weeks   History of syncope 02/2017   ED/ admission ;  neuro work-up  dx acute metabolic encephalothy , unknown etiology;  during admission on ecg monitor noted 2:1 block per cardio do echo (normal) continue to monitor after discharge outpt follow-up;   pt never follwed up w/ neuro or cardio   History of syncope 01/22/2019   (04-02-2024  pt stated no near syncope/ syncope since 2020 and did not follow up outpt w/ neuro or cardio)ED--- near syncope;  MRI negative , recommended neuro/ cardio followed up since  pt did not do previously in 2018   Iron  deficiency anemia    Uterine fibroid     Past Surgical History:  Procedure Laterality Date   NO PAST SURGERIES     ROBOT ASSISTED MYOMECTOMY N/A 04/08/2024   Procedure: MYOMECTOMY, ROBOT-ASSISTED;  Surgeon: Nicole Rubie LABOR, MD;  Location: [facility] OR;  Service: Gynecology;  Laterality: N/A;    FAMHx:  Family History  Problem Relation Age of Onset   Diabetes Paternal Grandmother     Diabetes Maternal Grandmother    Heart disease Father    Hypertension Father    Kidney disease Father    Hypertension Mother     SOCHx:   reports that she has never smoked. She has never been exposed to tobacco smoke. She has never used smokeless tobacco. She reports that she does not drink alcohol and does not use drugs.  ALLERGIES:  Allergies[1]  ROS: Pertinent items noted in HPI and remainder of comprehensive ROS otherwise negative.  HOME MEDS: Medications Ordered Prior to Encounter[2]  LABS/IMAGING: No results found for this or any previous visit (from the past 48 hours). No results found.  LIPID PANEL:    Component Value Date/Time   CHOL 297 (H) 05/13/2024 0836   TRIG 143 05/13/2024 0836   HDL 64 05/13/2024 0836   CHOLHDL 4.6 (H) 05/13/2024 0836   CHOLHDL 3.6 12/01/2020 1425   VLDL 13 12/01/2020 1425   LDLCALC 207 (H) 05/13/2024 0836    No results found for: LIPOA   WEIGHTS: Wt Readings from Last 3 Encounters:  06/25/24 215 lb (97.5 kg)  05/07/24 210 lb 3.2 oz (95.3 kg)  04/08/24 200 lb (90.7 kg)    VITALS: BP 110/64 (BP Location: Right Arm, Patient Position: Sitting, Cuff Size: Large)   Pulse 80   Ht 5' 5 (1.651 m)   Wt 215 lb (97.5 kg)   SpO2 99%   BMI 35.78  kg/m   EXAM: Deferred  EKG: Deferred  ASSESSMENT: 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   Nicole Benton has an LDL greater than 190 with a recent increase.  No significant family history that she is aware of of high cholesterol though this is concerning for a genetic dyslipidemia.  Will check genetic testing to rule out inherited cholesterol disorder given her heritage where familial hyperlipidemia is common.  Consider therapy based on that. She had a recent pregnancy with PPROM and IUFD. Unknown if she could be pregnant again, but that also could be associated with significant transient increase in cholesterol.  Thanks again for the kind referral.  Nicole KYM Maxcy, MD,  [facility], FNLA, FACP  [place]  [facility]  Medical Director of the [facility] &  Cardiovascular Risk Reduction Clinic Diplomate of the American Board of Clinical Lipidology Attending Cardiologist  Direct Dial: [phone]  Fax: [phone]  Website:  [email or web address]  Nicole Benton 06/26/2024, 9:56 PM     [1] No Known Allergies [2]  Current Outpatient Medications on File Prior to Visit  Medication Sig Dispense Refill   acetaminophen  (TYLENOL ) 500 MG tablet Take 1 tablet (500 mg total) by mouth every 6 (six) hours as needed.     cetirizine (ZYRTEC) 10 MG tablet Take 10 mg by mouth at bedtime.     Gauze Pads & Dressings (CURITY IODOFORM PACKING STRIP) MISC Apply topically as directed.     ibuprofen  (ADVIL ) 200 MG tablet Take 3 tablets (600 mg total) by mouth every 6 (six) hours as needed.     oxyCODONE  (ROXICODONE ) 5 MG immediate release tablet Take 1 tablet (5 mg total) by mouth every 4 (four) hours as needed for severe pain (pain score 7-10). 10 tablet 0   Prenatal Vit-Fe Fumarate-FA (PRENATAL MULTIVITAMIN) TABS tablet Take 1 tablet by mouth daily after lunch.     Prenatal Vit-Fe Fumarate-FA (PRENATAL VITAMINS) 28-0.8 MG TABS Take 1 tablet by mouth daily.     No current facility-administered medications on file prior to visit.

## 2024-07-24 ENCOUNTER — Encounter: Payer: Self-pay | Admitting: Internal Medicine

## 2024-07-29 ENCOUNTER — Encounter (HOSPITAL_BASED_OUTPATIENT_CLINIC_OR_DEPARTMENT_OTHER): Payer: Self-pay | Admitting: Internal Medicine

## 2024-07-29 DIAGNOSIS — E7800 Pure hypercholesterolemia, unspecified: Secondary | ICD-10-CM

## 2024-07-29 NOTE — Telephone Encounter (Signed)
 Mychart message sent by Dr Mona today at 4:30 pm

## 2024-07-29 NOTE — Telephone Encounter (Signed)
 Pt is requesting a callback regarding labs from December. Please advise

## 2024-07-30 NOTE — Telephone Encounter (Signed)
See mychart message encounter for details.

## 2024-08-03 MED ORDER — ROSUVASTATIN CALCIUM 20 MG PO TABS: 20.0000 mg | ORAL_TABLET | Freq: Every day | ORAL | 3 refills | Status: AC

## 2024-08-03 NOTE — Telephone Encounter (Signed)
 Crestor  ordered NMR ordered  Post-dated reminder message sent - due 10/2024

## 2025-05-11 ENCOUNTER — Encounter (HOSPITAL_BASED_OUTPATIENT_CLINIC_OR_DEPARTMENT_OTHER): Admitting: Family Medicine
